# Patient Record
Sex: Male | Born: 1955
Health system: Southern US, Community
[De-identification: ages and names within clinical notes are randomized; demographics above are authoritative.]

## PROBLEM LIST (undated history)

## (undated) DIAGNOSIS — K7460 Unspecified cirrhosis of liver: Secondary | ICD-10-CM

## (undated) DIAGNOSIS — B1920 Unspecified viral hepatitis C without hepatic coma: Secondary | ICD-10-CM

## (undated) DIAGNOSIS — I10 Essential (primary) hypertension: Secondary | ICD-10-CM

## (undated) DIAGNOSIS — F1020 Alcohol dependence, uncomplicated: Secondary | ICD-10-CM

## (undated) DIAGNOSIS — R188 Other ascites: Secondary | ICD-10-CM

## (undated) DIAGNOSIS — K429 Umbilical hernia without obstruction or gangrene: Secondary | ICD-10-CM

## (undated) DIAGNOSIS — I8500 Esophageal varices without bleeding: Secondary | ICD-10-CM

## (undated) DIAGNOSIS — K8020 Calculus of gallbladder without cholecystitis without obstruction: Secondary | ICD-10-CM

## (undated) DIAGNOSIS — K649 Unspecified hemorrhoids: Secondary | ICD-10-CM

## (undated) HISTORY — DX: Unspecified cirrhosis of liver: K74.60

## (undated) HISTORY — DX: Essential (primary) hypertension: I10

---

## 2000-12-08 ENCOUNTER — Emergency Department (HOSPITAL_COMMUNITY): Admission: EM | Admit: 2000-12-08 | Discharge: 2000-12-08 | Payer: Self-pay | Admitting: Emergency Medicine

## 2013-03-04 ENCOUNTER — Encounter (HOSPITAL_COMMUNITY): Payer: Self-pay | Admitting: Emergency Medicine

## 2013-03-04 ENCOUNTER — Emergency Department (HOSPITAL_COMMUNITY)
Admission: EM | Admit: 2013-03-04 | Discharge: 2013-03-04 | Disposition: A | Discharge status: Home / Self Care | Payer: Medicaid Other | Attending: Emergency Medicine | Admitting: Emergency Medicine

## 2013-03-04 DIAGNOSIS — R141 Gas pain: Secondary | ICD-10-CM | POA: Insufficient documentation

## 2013-03-04 DIAGNOSIS — R143 Flatulence: Secondary | ICD-10-CM

## 2013-03-04 DIAGNOSIS — K429 Umbilical hernia without obstruction or gangrene: Secondary | ICD-10-CM | POA: Insufficient documentation

## 2013-03-04 DIAGNOSIS — F17200 Nicotine dependence, unspecified, uncomplicated: Secondary | ICD-10-CM | POA: Insufficient documentation

## 2013-03-04 DIAGNOSIS — R142 Eructation: Secondary | ICD-10-CM | POA: Insufficient documentation

## 2013-03-04 NOTE — ED Provider Notes (Signed)
CSN: [id number]     Arrival date & time 03/04/13  2040 History   First MD Initiated Contact with Patient 03/04/13 2222     Chief Complaint  Patient presents with  . Umbilical Hernia   (Consider location/radiation/quality/duration/timing/severity/associated sxs/prior Treatment) HPI Comments: A couple of weeks ago noted umbilical protrusion.  Has no pain it is soft adn can be "pushed back in " Here tonight because he just wants to know what it is   The history is provided by the patient.    History reviewed. No pertinent past medical history. History reviewed. No pertinent past surgical history. No family history on file. History  Substance Use Topics  . Smoking status: Current Every Day Smoker    Types: Cigarettes  . Smokeless tobacco: Not on file  . Alcohol Use: Yes     Comment: 2-4 drinks on a daily basis    Review of Systems  Unable to perform ROS Gastrointestinal: Positive for abdominal distention. Negative for abdominal pain.  All other systems reviewed and are negative.    Allergies  Review of patient's allergies indicates no known allergies.  Home Medications  No current outpatient prescriptions on file. BP 163/100  Pulse 73  Temp(Src) 98.4 F (36.9 C) (Oral)  Resp 16  Ht 6\' 2"  (1.88 m)  Wt 181 lb (82.101 kg)  BMI 23.23 kg/m2  SpO2 100% Physical Exam  Nursing note and vitals reviewed. Constitutional: He is oriented to person, place, and time. He appears well-developed and well-nourished.  HENT:  Head: Normocephalic.  Eyes: Pupils are equal, round, and reactive to light.  Neck: Normal range of motion.  Cardiovascular: Normal rate.   Pulmonary/Chest: Effort normal.  Abdominal: Soft. Bowel sounds are normal. He exhibits distension. There is no tenderness. There is no rebound. A hernia is present.  Easily reducible umbilical hernia  Musculoskeletal: Normal range of motion.  Neurological: He is alert and oriented to person, place, and time.  Skin: Skin is  warm.    ED Course  Procedures (including critical care time) Labs Review Labs Reviewed - No data to display Imaging Review No results found.  EKG Interpretation   None       MDM   1. Hernia, umbilical    Patient reassured and informed      Garald Balding, NP 03/04/13 2230  Garald Balding, NP 03/04/13 2231

## 2013-03-04 NOTE — Discharge Instructions (Signed)
Hernia A hernia occurs when an internal organ pushes out through a weak spot in the abdominal wall. Hernias most commonly occur in the groin and around the navel. Hernias often can be pushed back into place (reduced). Most hernias tend to get worse over time. Some abdominal hernias can get stuck in the opening (irreducible or incarcerated hernia) and cannot be reduced. An irreducible abdominal hernia which is tightly squeezed into the opening is at risk for impaired blood supply (strangulated hernia). A strangulated hernia is a medical emergency. Because of the risk for an irreducible or strangulated hernia, surgery may be recommended to repair a hernia. CAUSES   Heavy lifting.  Prolonged coughing.  Straining to have a bowel movement.  A cut (incision) made during an abdominal surgery. HOME CARE INSTRUCTIONS   Bed rest is not required. You may continue your normal activities.  Avoid lifting more than 10 pounds (4.5 kg) or straining.  Cough gently. If you are a smoker it is best to stop. Even the best hernia repair can break down with the continual strain of coughing. Even if you do not have your hernia repaired, a cough will continue to aggravate the problem.  Do not wear anything tight over your hernia. Do not try to keep it in with an outside bandage or truss. These can damage abdominal contents if they are trapped within the hernia sac.  Eat a normal diet.  Avoid constipation. Straining over long periods of time will increase hernia size and encourage breakdown of repairs. If you cannot do this with diet alone, stool softeners may be used. SEEK IMMEDIATE MEDICAL CARE IF:   You have a fever.  You develop increasing abdominal pain.  You feel nauseous or vomit.  Your hernia is stuck outside the abdomen, looks discolored, feels hard, or is tender.  You have any changes in your bowel habits or in the hernia that are unusual for you.  You have increased pain or swelling around the  hernia.  You cannot push the hernia back in place by applying gentle pressure while lying down. MAKE SURE YOU:   Understand these instructions.  Will watch your condition.  Will get help right away if you are not doing well or get worse. Document Released: 02/14/2005 Document Revised: 05/09/2011 Document Reviewed: 10/04/2007 [facility] Patient Information 2014 [place].  Emergency Department Resource Guide 1) Find a Doctor and Pay Out of Pocket Although you won't have to find out who is covered by your insurance plan, it is a good idea to ask around and get recommendations. You will then need to call the office and see if the doctor you have chosen will accept you as a new patient and what types of options they offer for patients who are self-pay. Some doctors offer discounts or will set up payment plans for their patients who do not have insurance, but you will need to ask so you aren't surprised when you get to your appointment.  2) Contact Your Local Health Department Not all health departments have doctors that can see patients for sick visits, but many do, so it is worth a call to see if yours does. If you don't know where your local health department is, you can check in your phone book. The CDC also has a tool to help you locate your state's health department, and many state websites also have listings of all of their local health departments.  3) Find a [facility] If your illness is not likely to  be very severe or complicated, you may want to try a walk in clinic. These are popping up all over the country in pharmacies, drugstores, and shopping centers. They're usually staffed by nurse practitioners or physician assistants that have been trained to treat common illnesses and complaints. They're usually fairly quick and inexpensive. However, if you have serious medical issues or chronic medical problems, these are probably not your best option.  No Primary Care Doctor: - Call  Health Connect at  [phone] - they can help you locate a primary care doctor that  accepts your insurance, provides certain services, etc. - Physician Referral Service- [phone]  Chronic Pain Problems: Organization         Address  Phone   Notes  [facility]  [phone] Patients need to be referred by their primary care doctor.   Medication Assistance: Organization         Address  Phone   Notes  [facility] Medication [facility] [address] [place], [place] [address] [phone] --Must be a resident of [facility] -- Must have NO insurance coverage whatsoever (no Medicaid/ Medicare, etc.) -- The pt. MUST have a primary care doctor that directs their care regularly and follows them in the community   MedAssist  [phone]   [employer]  [phone]    Agencies that provide inexpensive medical care: Organization         Address  Phone   Notes  [place]  [phone]   [facility]    [phone]   [facility] [place], [place] [address] [phone]   [place] [address] [place] [phone]   Planned Parenthood    [phone]   [facility]    [phone]   [place] and [place] [address], [place] Phone:  [phone], Fax:  [phone] Hours of Operation:  9 am - 6 pm, M-F.  Also accepts Medicaid/Medicare and self-pay.  [facility] for [place] [address], [address], [place] Phone: [phone], Fax: [phone]. Hours of Operation:  8:30 am - 5:30 pm, M-F.  Also accepts Medicaid and self-pay.  [facility] [place] [address], [place] Phone: [phone]   [place], [place], [place] [phone], Ext. [phone] Mondays & Thursdays: 7-9 AM.  First 15 patients are seen on a first come, first serve  basis.    [place] Providers:  Organization         Address  Phone   Notes  [facility] [address], [place] [phone] Also accepts self-pay patients.  [facility] [id number] [place], [place]  [phone]   [place], [address], [place] [phone]   [facility] [address], [place] [phone]   Lucianne Lei [address], [place]   [phone] Only accepts [place] Access [place] patients after they have their name applied to their card.   Self-Pay (no insurance) in [facility]:  Organization         Address  Phone   Notes  Sickle Cell Patients, [facility] Internal Medicine [place] [phone]   [facility] [place] [phone]   [facility] [place]  [place], [address], [place] [phone]   [facility]/Dr. Osei-Bonsu  [address] [place] or [address], [place] [phone] Phone number for both [place] and [place] locations is the same.  Urgent Medical and [facility] [address], [place] [phone]   [facility] [address] [place] or [address] [phone]   [facility] [address] [place] [phone], phone; [phone], fax Sees patients 1st and 3rd Saturday of every month.  Must not qualify for public or private insurance (i.e. Medicaid, Medicare, [place] Health Choice, Veterans' Benefits)  Household income should be no more than 200% of the poverty level The clinic cannot treat you if you are pregnant or think you are pregnant  Sexually transmitted diseases are not treated at the clinic.    Dental Care: Organization         Address  Phone  Notes  [facility] Department of [facility] [place] [phone] Accepts children up to age 64 who are enrolled in [place] or [place]; pregnant women with a Medicaid card; and children who have applied for Medicaid or [place] Health Choice, but were declined, whose parents can pay a reduced fee at time of service.  [facility] Department of [facility]  [address], [place] [phone] Accepts children up to age 5 who are enrolled in [place] or [place]; pregnant women with a Medicaid card; and children who have applied for Medicaid or [place] Health Choice, but were declined, whose parents can pay a reduced fee at time of service.  [place] Access PRO[facility]  [place] [phone] Patients are seen by appointment only. Walk-ins are not accepted. Alicia will see patients 57 years of age and older. Monday - Tuesday (8am-5pm) Most Wednesdays (8:30-5pm) $30 per visit, cash only  [facility] Adult Dental Access PROGRAM  [address], [facility] [phone] Patients are seen by appointment only. Walk-ins are not accepted. [place] will see patients 62 years of age and older. One Wednesday Evening (Monthly: Volunteer Based).  $30 per visit, cash only  [place]  [phone] for adults; Children under age 66, call Graduate Pediatric Dentistry at [phone]. Children aged 15-14, please call [phone] to request a pediatric application.  Dental services are provided in all areas of dental care including fillings, crowns and bridges, complete and partial dentures, implants, gum treatment, root canals, and extractions. Preventive care is also provided. Treatment is provided to both adults and children. Patients are selected via a lottery and there is often a waiting list.   [facility] [address], [place]  [phone]  [email or web address]   [facility] [address] [place], [place] [phone], Ext. [phone] Second and Fourth Thursday of each month, opens at 6:30 AM; Clinic ends at 9 AM.  Patients are seen on a first-come first-served basis, and a limited number are seen during each clinic.   [facility]  [address] Hillard Danker [place], [place] [phone]   Eligibility Requirements You must have lived in [place], [place], or [place] counties for at least the last three months.  You cannot be eligible for state or federal sponsored [employer], including [employer], [place], or [employer].   You generally cannot be eligible for healthcare insurance through your employer.    How to apply: Eligibility screenings are held every Tuesday and Wednesday afternoon from 1:00 pm until 4:00 pm. You do not need an appointment for the interview!  [facility] [address], [place], [place]   [place]  [place] Department  [place]  [phone]    Behavioral Health Resources in the Community: Intensive Outpatient Programs Organization         Address  Phone  Notes  [place]. [address] [place], [place] [phone]   [facility] Outpatient [address], [place], [place]   ADS: Alcohol & Drug Svcs [address] [place], [place]   [place] [address]  [place], [place] or [phone]   Substance Abuse Resources Organization         Address  Phone  Notes  Alcohol and Drug Services  [phone]   [place]  [phone]   The [place]   Chinita Pester  [phone]   Residential & Outpatient Substance Abuse Program  [phone]   Psychological Services Organization          Address  Phone  Notes  [facility] [place]  [place]  [phone]   [place] [address], [place] or [phone]    Mobile Crisis Teams Organization         Address  Phone  Notes  [facility]rapeutic Alternatives, Mobile Crisis Care Unit  [phone]   Assertive Psychotherapeutic Services  [address] [place], [place]   [place] Levels [address] [place] [phone]    Self-Help/Support Groups Organization         Address  Phone             Notes  [place]. of [place] - variety of support groups  [place] Call for more information  Narcotics Anonymous (NA), [facility]aring [facility] [address], [employer] [place]  2 meetings at this location   Special educational needs teacher         Address  Phone  Notes  [facility] Residential Treatment [place],    [place]  [phone]   [facility]  [address] [place] [phone], [place], [place]   [place], [place] [phone] Admissions: 8am-3pm M-F  Incentives Substance [place] [address],    [place], [place] [phone]   [facility] [address] [place], [place], [place]   [facility] [address],  [place], [place]   Insight Programs - Intensive Outpatient [address] Kristeen [address], [place], [place]   [facility] ([place].) [place].,  [place], [place] [phone] or [phone]   [facility] Services (RTS) [address] [place], [place] Accepts Medicaid  [facility] [place] [address]  [place] [phone] Substance Abuse/Addiction Treatment   [facility] Organization         Address  Phone  Notes  [facility]  [phone]   Domenic Schwab, PhD [phone]  [place]  Rd[place]ulio [place], [place]   [phone] or ([place]) [place], [place] [phone]   [facility] Recovery [address] [place], [place] [phone] Insurance/Medicaid/sponsorship through [facility] and Families [address] [place], [place] [phone] [place], [place] [phone]    Dr. Adele Schilder  [phone]   [facility] Dept. 1) [address] [place] 2) [place] 3)  [address], [place] [phone]  [phone]   [place] [phone] or [phone] (After Hours)    As discussed it your hernia starts to be painful or can not be pushed back in than return to the ED for treatment

## 2013-03-04 NOTE — ED Notes (Signed)
Pt reports noting a protrusion from his umbilicus x2 weeks ago, denies any pain or redness - pt was donating plasma today and was told is was possibly a hernia, pt states "i am just here for a diagnosis" - denies any other associating symptoms.

## 2013-03-04 NOTE — ED Provider Notes (Signed)
Medical screening examination/treatment/procedure(s) were conducted as a shared visit with non-physician practitioner(s) or resident  and myself.  I personally evaluated the patient during the encounter and agree with the findings and plan unless otherwise indicated.    I have personally reviewed any xrays and/ or EKG's with the provider and I agree with interpretation.   Umbilical hernia, no pain, no signs of infection. Exam abd soft/ NT, small umbilical hernia- easily reduceable, well appearing.  Reasons to return given, discussed what a hernia is.    Umbilical hernia.    Mariea Clonts, MD 03/04/13 [phone]

## 2013-03-31 DIAGNOSIS — K8020 Calculus of gallbladder without cholecystitis without obstruction: Secondary | ICD-10-CM

## 2013-03-31 DIAGNOSIS — R188 Other ascites: Secondary | ICD-10-CM

## 2013-03-31 HISTORY — DX: Other ascites: R18.8

## 2013-03-31 HISTORY — DX: Calculus of gallbladder without cholecystitis without obstruction: K80.20

## 2013-04-22 ENCOUNTER — Emergency Department (HOSPITAL_COMMUNITY): Payer: Medicaid Other

## 2013-04-22 ENCOUNTER — Encounter (HOSPITAL_COMMUNITY): Payer: Self-pay | Admitting: Emergency Medicine

## 2013-04-22 ENCOUNTER — Emergency Department (HOSPITAL_COMMUNITY)
Admission: EM | Admit: 2013-04-22 | Discharge: 2013-04-22 | Disposition: A | Discharge status: Home / Self Care | Payer: Medicaid Other | Attending: Emergency Medicine | Admitting: Emergency Medicine

## 2013-04-22 DIAGNOSIS — K759 Inflammatory liver disease, unspecified: Secondary | ICD-10-CM | POA: Insufficient documentation

## 2013-04-22 DIAGNOSIS — K429 Umbilical hernia without obstruction or gangrene: Secondary | ICD-10-CM | POA: Insufficient documentation

## 2013-04-22 DIAGNOSIS — R188 Other ascites: Secondary | ICD-10-CM

## 2013-04-22 DIAGNOSIS — F17200 Nicotine dependence, unspecified, uncomplicated: Secondary | ICD-10-CM | POA: Insufficient documentation

## 2013-04-22 HISTORY — DX: Other ascites: R18.8

## 2013-04-22 LAB — COMPREHENSIVE METABOLIC PANEL
ALT: 24 U/L (ref 0–53)
AST: 63 U/L — ABNORMAL HIGH (ref 0–37)
Albumin: 2.9 g/dL — ABNORMAL LOW (ref 3.5–5.2)
Alkaline Phosphatase: 180 U/L — ABNORMAL HIGH (ref 39–117)
BUN: 6 mg/dL (ref 6–23)
CO2: 24 meq/L (ref 19–32)
Calcium: 8.8 mg/dL (ref 8.4–10.5)
Chloride: 98 mEq/L (ref 96–112)
Creatinine, Ser: 0.85 mg/dL (ref 0.50–1.35)
GFR calc non Af Amer: >90 mL/min (ref >90)
GLUCOSE: 75 mg/dL (ref 70–99)
POTASSIUM: 3.7 meq/L (ref 3.7–5.3)
Sodium: 136 mEq/L — ABNORMAL LOW (ref 137–147)
Total Bilirubin: 1.2 mg/dL (ref 0.3–1.2)
Total Protein: 9.5 g/dL — ABNORMAL HIGH (ref 6.0–8.3)

## 2013-04-22 LAB — CBC WITH DIFFERENTIAL/PLATELET
Basophils Absolute: 0 10*3/uL (ref 0.0–0.1)
Basophils Relative: 1 % (ref 0–1)
Eosinophils Absolute: 0.1 10*3/uL (ref 0.0–0.7)
Eosinophils Relative: 1 % (ref 0–5)
HCT: 40.9 % (ref 39.0–52.0)
HEMOGLOBIN: 14.2 g/dL (ref 13.0–17.0)
LYMPHS ABS: 2.7 10*3/uL (ref 0.7–4.0)
LYMPHS PCT: 55 % — AB (ref 12–46)
MCH: 30 pg (ref 26.0–34.0)
MCHC: 34.7 g/dL (ref 30.0–36.0)
MCV: 86.3 fL (ref 78.0–100.0)
Monocytes Absolute: 0.4 10*3/uL (ref 0.1–1.0)
Monocytes Relative: 7 % (ref 3–12)
NEUTROS ABS: 1.7 10*3/uL (ref 1.7–7.7)
NEUTROS PCT: 36 % — AB (ref 43–77)
Platelets: 212 10*3/uL (ref 150–400)
RBC: 4.74 MIL/uL (ref 4.22–5.81)
RDW: 13.6 % (ref 11.5–15.5)
WBC: 4.9 10*3/uL (ref 4.0–10.5)

## 2013-04-22 LAB — URINALYSIS, ROUTINE W REFLEX MICROSCOPIC
Bilirubin Urine: NEGATIVE
Glucose, UA: NEGATIVE mg/dL
Hgb urine dipstick: NEGATIVE
KETONES UR: NEGATIVE mg/dL
NITRITE: NEGATIVE
Protein, ur: NEGATIVE mg/dL
Specific Gravity, Urine: 1.012 (ref 1.005–1.030)
Urobilinogen, UA: 1 mg/dL (ref 0.0–1.0)
pH: 5.5 (ref 5.0–8.0)

## 2013-04-22 LAB — URINE MICROSCOPIC-ADD ON

## 2013-04-22 LAB — LIPASE, BLOOD: LIPASE: 29 U/L (ref 11–59)

## 2013-04-22 NOTE — ED Notes (Signed)
States went to give plasma and was told he needed to have abd evaled , pt states abd  Has been this way since last month abd large and distended has umblical hernia and states it has been giving him problem and was recently dx w/ hep C

## 2013-04-22 NOTE — ED Notes (Addendum)
Pt reports having problems with his umbilical hernia and also wants to be checked for Hep C. Pt presents with protruding umbilical area black in color and hard distended abdomen. Last BM today but light. Last good formed stool couple days ago. Pt also c/o redness and swelling to B/L LE.

## 2013-04-22 NOTE — ED Provider Notes (Signed)
CSN: [id number]     Arrival date & time 04/22/13  6195 History   First MD Initiated Contact with Patient 04/22/13 [phone]     Chief Complaint  Patient presents with  . Hernia    umbilical     (Consider location/radiation/quality/duration/timing/severity/associated sxs/prior Treatment) HPI Pt presenting with c/o abdominal distension and swelling.  This is making his prior umbilical hernia protrude more than it had in the past.  He was told in December 2014 when donating plasma that he had tested positive for Hep C.  He has gradually developed swelling of his abdomen.  No abdominal pain, but states it is difficult to get comfortable due to the distension.  He also has some bilateral lower extremity swelling.  No fever/chills.  No vomiting.  There are no other associated systemic symptoms, there are no other alleviating or modifying factors.   Past Medical History  Diagnosis Date  . Hepatitis   . Ascites    History reviewed. No pertinent past surgical history. No family history on file. History  Substance Use Topics  . Smoking status: Current Every Day Smoker    Types: Cigarettes  . Smokeless tobacco: Not on file  . Alcohol Use: Yes     Comment: 2-4 drinks on a daily basis    Review of Systems ROS reviewed and all otherwise negative except for mentioned in HPI    Allergies  Review of patient's allergies indicates no known allergies.  Home Medications  No current outpatient prescriptions on file. BP 145/89  Pulse 70  Temp(Src) 97.9 F (36.6 C) (Oral)  Resp 20  Ht 6\' 2"  (1.88 m)  Wt 191 lb (86.637 kg)  BMI 24.51 kg/m2  SpO2 100% Vitals reviewed Physical Exam Physical Examination: General appearance - alert, well appearing, and in no distress Mental status - alert, oriented to person, place, and time Eyes - no conjunctival injection, no scleral icterus Mouth - mucous membranes moist, pharynx normal without lesions Chest - clear to auscultation, no wheezes, rales or  rhonchi, symmetric air entry Heart - normal rate, regular rhythm, normal S1, S2, no murmurs, rubs, clicks or gallops Abdomen - soft, nontender, diffuse distention of abdomen with protrusion of easily reducible approx 3cm umbilical hernia, no masses or organomegaly Extremities - peripheral pulses normal, no pedal edema, no clubbing or cyanosis Skin - normal coloration and turgor, no rashes  ED Course  Procedures (including critical care time) Labs Review Labs Reviewed  CBC WITH DIFFERENTIAL - Abnormal; Notable for the following:    Neutrophils Relative % 36 (*)    Lymphocytes Relative 55 (*)    All other components within normal limits  COMPREHENSIVE METABOLIC PANEL - Abnormal; Notable for the following:    Sodium 136 (*)    Total Protein 9.5 (*)    Albumin 2.9 (*)    AST 63 (*)    Alkaline Phosphatase 180 (*)    All other components within normal limits  URINALYSIS, ROUTINE W REFLEX MICROSCOPIC - Abnormal; Notable for the following:    Color, Urine AMBER (*)    APPearance HAZY (*)    Leukocytes, UA TRACE (*)    All other components within normal limits  LIPASE, BLOOD  URINE MICROSCOPIC-ADD ON   Imaging Review No results found.   EKG Interpretation  Date/Time:    Ventricular Rate:    PR Interval:    QRS Duration:   QT Interval:    QTC Calculation:   R Axis:     Text Interpretation:  MDM   Final diagnoses:  Ascites  Hepatitis    Pt presenting with c/o abdominal distension and protrusion of umbilcal hernia.  He has no fever, no abdominal tenderness- he is uncomfortable due to the ascites present.  He was recently diagnosed with hepatitis C.  Pt had therapeuric paracentesis and feels much improved.  Given information for GI followup.  Discharged with strict return precautions.  Pt agreeable with plan.   Threasa Beards, MD 04/26/13 [phone]

## 2013-04-22 NOTE — Discharge Instructions (Signed)
Return to the ED with any concerns including fever, abdominal pain, vomiting, decreased level of alertness/lethargy, or any other alarming symptoms

## 2013-04-22 NOTE — Procedures (Signed)
Successful US guided paracentesis from RLQ.  Yielded 6L of clear yellow fluid.  No immediate complications.  Pt tolerated well.   Specimen was not sent for labs.  Ascencion Dike PA-C 04/22/2013 1:53 PM\

## 2013-04-22 NOTE — ED Notes (Signed)
Was told in dec after he went back to donate plasma a second time that he had hep c

## 2013-05-06 ENCOUNTER — Emergency Department (HOSPITAL_COMMUNITY)
Admission: EM | Admit: 2013-05-06 | Discharge: 2013-05-06 | Disposition: A | Discharge status: Home / Self Care | Payer: Medicaid Other | Attending: Emergency Medicine | Admitting: Emergency Medicine

## 2013-05-06 ENCOUNTER — Emergency Department (HOSPITAL_COMMUNITY): Payer: Medicaid Other

## 2013-05-06 ENCOUNTER — Encounter (HOSPITAL_COMMUNITY): Payer: Self-pay | Admitting: Emergency Medicine

## 2013-05-06 DIAGNOSIS — Z8619 Personal history of other infectious and parasitic diseases: Secondary | ICD-10-CM | POA: Insufficient documentation

## 2013-05-06 DIAGNOSIS — M7989 Other specified soft tissue disorders: Secondary | ICD-10-CM

## 2013-05-06 DIAGNOSIS — R188 Other ascites: Secondary | ICD-10-CM | POA: Insufficient documentation

## 2013-05-06 DIAGNOSIS — F17200 Nicotine dependence, unspecified, uncomplicated: Secondary | ICD-10-CM | POA: Insufficient documentation

## 2013-05-06 LAB — URINALYSIS, ROUTINE W REFLEX MICROSCOPIC
Glucose, UA: NEGATIVE mg/dL
Hgb urine dipstick: NEGATIVE
KETONES UR: 15 mg/dL — AB
NITRITE: POSITIVE — AB
Protein, ur: NEGATIVE mg/dL
Specific Gravity, Urine: 1.029 (ref 1.005–1.030)
pH: 5.5 (ref 5.0–8.0)

## 2013-05-06 LAB — COMPREHENSIVE METABOLIC PANEL
ALBUMIN: 2.3 g/dL — AB (ref 3.5–5.2)
ALK PHOS: 153 U/L — AB (ref 39–117)
ALT: 15 U/L (ref 0–53)
AST: 48 U/L — ABNORMAL HIGH (ref 0–37)
BILIRUBIN TOTAL: 0.9 mg/dL (ref 0.3–1.2)
BUN: 8 mg/dL (ref 6–23)
CHLORIDE: 99 meq/L (ref 96–112)
CO2: 27 meq/L (ref 19–32)
CREATININE: 0.86 mg/dL (ref 0.50–1.35)
Calcium: 8.5 mg/dL (ref 8.4–10.5)
GLUCOSE: 86 mg/dL (ref 70–99)
Potassium: 3.8 mEq/L (ref 3.7–5.3)
Sodium: 137 mEq/L (ref 137–147)
Total Protein: 7.5 g/dL (ref 6.0–8.3)

## 2013-05-06 LAB — CBC WITH DIFFERENTIAL/PLATELET
BASOS PCT: 1 % (ref 0–1)
Basophils Absolute: 0 10*3/uL (ref 0.0–0.1)
Eosinophils Absolute: 0 10*3/uL (ref 0.0–0.7)
Eosinophils Relative: 0 % (ref 0–5)
HCT: 38.2 % — ABNORMAL LOW (ref 39.0–52.0)
HEMOGLOBIN: 13.5 g/dL (ref 13.0–17.0)
LYMPHS ABS: 2.2 10*3/uL (ref 0.7–4.0)
Lymphocytes Relative: 45 % (ref 12–46)
MCH: 29.7 pg (ref 26.0–34.0)
MCHC: 35.3 g/dL (ref 30.0–36.0)
MCV: 84.1 fL (ref 78.0–100.0)
MONO ABS: 0.5 10*3/uL (ref 0.1–1.0)
MONOS PCT: 10 % (ref 3–12)
NEUTROS ABS: 2.1 10*3/uL (ref 1.7–7.7)
Neutrophils Relative %: 43 % (ref 43–77)
Platelets: 195 10*3/uL (ref 150–400)
RBC: 4.54 MIL/uL (ref 4.22–5.81)
RDW: 13.5 % (ref 11.5–15.5)
WBC: 4.8 10*3/uL (ref 4.0–10.5)

## 2013-05-06 LAB — URINE MICROSCOPIC-ADD ON

## 2013-05-06 LAB — AMMONIA: AMMONIA: 26 umol/L (ref 11–60)

## 2013-05-06 MED ORDER — MORPHINE SULFATE 4 MG/ML IJ SOLN
6.0000 mg | Freq: Once | INTRAMUSCULAR | Status: DC
Start: 2013-05-06 — End: 2013-05-06

## 2013-05-06 MED ORDER — ONDANSETRON HCL 4 MG/2ML IJ SOLN: 4.0000 mg | Freq: Once | INTRAMUSCULAR | Status: DC

## 2013-05-06 NOTE — Procedures (Signed)
Successful US guided paracentesis from RLQ.  Yielded 9L of clear yellow fluid.  No immediate complications.  Pt tolerated well.   Specimen was not sent for labs.  Ascencion Dike PA-C 05/06/2013 3:24 PM

## 2013-05-06 NOTE — Discharge Instructions (Signed)
Ascites °Ascites is a gathering of fluid in the belly (abdomen). This is most often caused by liver disease. It may also be caused by a number of other less common problems. It causes a ballooning out (distension) of the abdomen. °CAUSES  °Scarring of the liver (cirrhosis) is the most common cause of ascites. Other causes include: °· Infection or inflammation in the abdomen. °· Cancer in the abdomen. °· Heart failure. °· Certain forms of kidney failure (nephritic syndrome). °· Inflammation of the pancreas. °· Clots in the veins of the liver. °SYMPTOMS  °In the early stages of ascites, you may not have any symptoms. The main symptom of ascites is a sense of abdominal bloating. This is due to the presence of fluid. This may also cause an increase in abdominal or waist size. People with this condition can develop swelling in the legs, and men can develop a swollen scrotum. When there is a lot of fluid, it may be hard to breath. Stretching of the abdomen by fluid can be painful. °DIAGNOSIS  °Certain features of your medical history, such as a history of liver disease and of an enlarging abdomen, can suggest the presence of ascites. The diagnosis of ascites can be made on physical exam by your caregiver. An abdominal ultrasound examination can confirm that ascites is present, and estimate the amount of fluid. °Once ascites is confirmed, it is important to determine its cause. Again, a history of one of the conditions listed in "CAUSES" provides a strong clue. A physical exam is important, and blood and X-ray tests may be needed. During a procedure called paracentesis, a sample of fluid is removed from the abdomen. This can determine certain key features about the fluid, such as whether or not infection or cancer is present. Your caregiver will determine if a paracentesis is necessary. They will describe the procedure to you. °PREVENTION  °Ascites is a complication of other conditions. Therefore to prevent ascites, you  must seek treatment for any significant health conditions you have. Once ascites is present, careful attention to fluid and salt intake may help prevent it from getting worse. If you have ascites, you should not drink alcohol. °PROGNOSIS  °The prognosis of ascites depends on the underlying disease. If the disease is reversible, such as with certain infections or with heart failure, then ascites may improve or disappear. When ascites is caused by cirrhosis, then it indicates that the liver disease has worsened, and further evaluation and treatment of the liver disease is needed. If your ascites is caused by cancer, then the success or failure of the cancer treatment will determine whether your ascites will improve or worsen. °RISKS AND COMPLICATIONS  °Ascites is likely to worsen if it is not properly diagnosed and treated. A large amount of ascites can cause pain and difficulty breathing. The main complication, besides worsening, is infection (called spontaneous bacterial peritonitis). This requires prompt treatment. °TREATMENT  °The treatment of ascites depends on its cause. When liver disease is your cause, medical management using water pills (diuretics) and decreasing salt intake is often effective. Ascites due to peritoneal inflammation or malignancy (cancer) alone does not respond to salt restriction and diuretics. Hospitalization is sometimes required. °If the treatment of ascites cannot be managed with medications, a number of other treatments are available. Your caregivers will help you decide which will work best for you. Some of these are: °· Removal of fluid from the abdomen (paracentesis). °· Fluid from the abdomen is passed into a vein (peritoneovenous shunting). °·   Liver transplantation.  Transjugular intrahepatic portosystemic stent shunt. HOME CARE INSTRUCTIONS  It is important to monitor body weight and the intake and output of fluids. Weigh yourself at the same time every day. Record your  weights. Fluid restriction may be necessary. It is also important to know your salt intake. The more salt you take in, the more fluid you will retain. Ninety percent of people with ascites respond to this approach.  Follow any directions for medicines carefully.  Follow up with your caregiver, as directed.  Report any changes in your health, especially any new or worsening symptoms.  If your ascites is from liver disease, avoid alcohol and other substances toxic to the liver. SEEK MEDICAL CARE IF:   Your weight increases more than a few pounds in a few days.  Your abdominal or waist size increases.  You develop swelling in your legs.  You had swelling and it worsens. SEEK IMMEDIATE MEDICAL CARE IF:   You develop a fever.  You develop new abdominal pain.  You develop difficulty breathing.  You develop confusion.  You have bleeding from the mouth, stomach, or rectum. MAKE SURE YOU:   Understand these instructions.  Will watch your condition.  Will get help right away if you are not doing well or get worse. Document Released: 02/14/2005 Document Revised: 05/09/2011 Document Reviewed: 09/15/2006 [facility] Patient Information 2014 Shawn Nguyen.   Paracentesis Paracentesis is a procedure used to remove excess fluid from the belly (abdomen). Excess fluid in the belly is called ascites. Excess fluid can be the result of certain conditions, such as infection, inflammation, abdominal injury, heart failure, chronic scarring of the liver (cirrhosis), or cancer. The excess fluid is removed using a needle inserted through the skin and tissue into the abdomen.  A paracentesis may be done to:  Determine the cause of the excess fluid through examination of the fluid.  Relieve symptoms of shortness of breath or pain caused by the excess fluid.  Determine presence of bleeding after an abdominal injury. LET YOUR CAREGIVERS KNOW ABOUT:  Allergies.  Medications taken including herbs,  eye drops, over-the-counter medications, and creams.  Use of steroids (by mouth or creams).  Previous problems with anesthetics or numbing medicine.  Possibility of pregnancy, if this applies.  History of blood clots (thrombophlebitis).  History of bleeding or blood problems.  Previous surgery.  Other health problems. RISKS AND COMPLICATIONS  Injury to an abdominal organ, such as the bowel (large intestine), liver, spleen, or bladder.  Possible infection.  Bleeding.  Low blood pressure (hypotension). BEFORE THE PROCEDURE This is a procedure that can be done as an outpatient. Confirm the time that you need to arrive for your procedure. A blood sample may be done to determine your blood clotting time. The presence of a severe bleeding disorder (coagulopathy) which cannot be promptly corrected may make this procedure inadvisable. You may be asked to urinate. PROCEDURE The procedure will take about 30 minutes. This time will vary depending on the amount of fluid that is removed. You may be asked to lie on your back with your head elevated. An area on your abdomen will be cleansed. A numbing medicine may then be injected (local anesthesia) into the skin and tissue. A needle is inserted through your abdominal skin and tissues until it is positioned in your abdomen. You may feel pressure or slight pain as the needle is positioned into the abdomen. Fluid is removed from the abdomen through the needle. Tell your caregiver if you  feel dizzy or lightheaded. The needle is withdrawn once the desired amount of fluid has been removed. A sample of the fluid may be sent for examination.  AFTER THE PROCEDURE Your recovery will be assessed and monitored. If there are no problems, as an outpatient, you should be able to go home shortly after the procedure. There may be a very limited amount of clear fluid draining from the needle insertion site over the next 2 days. Confirm with your caregiver as to the  expected amount of drainage. Obtaining the Test Results It is your responsibility to obtain your test results. Do not assume everything is normal if you have not heard from your caregiver or the medical facility. It is important for you to follow up on all of your test results. HOME CARE INSTRUCTIONS   You may resume normal diet and activities as directed or allowed.  Only take over-the-counter or prescription medicines for pain, discomfort, or fever as directed by your caregiver. SEEK IMMEDIATE MEDICAL CARE IF:  You develop shortness of breath or chest pain.  You develop increasing pain, discomfort, or swelling in your abdomen.  You develop new drainage or pus coming from site where fluid was removed.  You develop swelling or increased redness from site where fluid was removed.  You develop an unexplained temperature of 102 F (38.9 C) or above. Document Released: 08/30/2004 Document Revised: 05/09/2011 Document Reviewed: 10/06/2008 [facility] Patient Information 2014 [employer].

## 2013-05-06 NOTE — Progress Notes (Signed)
*  Preliminary Results* Right lower extremity venous duplex completed. Right lower extremity is negative for deep vein thrombosis. There is no evidence of right Baker's cyst.  05/06/2013 12:50 PM  Maudry Mayhew, RVT, RDCS, RDMS

## 2013-05-06 NOTE — ED Provider Notes (Signed)
CSN: [id number]     Arrival date & time 05/06/13  0848 History   First MD Initiated Contact with Patient 05/06/13 [phone]     No chief complaint on file.    (Consider location/radiation/quality/duration/timing/severity/associated sxs/prior Treatment) HPI Shawn Nguyen Is a 58 year old male who presents the emergency department chief complaint of abdominal swelling.  The patient was seen here 2 weeks ago on 04/22/2013 for new onset ascites with a diagnosis of hepatitis C.  Patient states that this is all new to him.  He was discharged with instructions to followup with gastroenterology.  Patient states he did attempt to follow up however he was denied the visit because he is uninsured and does not have the money to pay.  Patient presents today with increased abdominal swelling, he's got swelling bilateral legs worse on the right.  He has no abdominal pain, fever, chills, heat or redness to the belly.  And does complain of some shortness of breath secondary to his increased abdominal girth.  Patient denies alcohol abuse or Tylenol abuse.  Past Medical History  Diagnosis Date  . Hepatitis   . Ascites    No past surgical history on file. No family history on file. History  Substance Use Topics  . Smoking status: Current Every Day Smoker    Types: Cigarettes  . Smokeless tobacco: Not on file  . Alcohol Use: Yes     Comment: 2-4 drinks on a daily basis    Review of Systems  Ten systems reviewed and are negative for acute change, except as noted in the HPI.    Allergies  Review of patient's allergies indicates no known allergies.  Home Medications  No current outpatient prescriptions on file. There were no vitals taken for this visit. Physical Exam  Nursing note and vitals reviewed. Constitutional: He appears well-developed and well-nourished. No distress.  HENT:  Head: Normocephalic and atraumatic.  Multiple missing front teeth  Eyes: Conjunctivae are normal. No scleral icterus.   Neck: Normal range of motion. Neck supple.  Cardiovascular: Normal rate, regular rhythm and normal heart sounds.   Bilateral pitting edema, increased swelling on the right lower extremity.  Distal pulses intact  Pulmonary/Chest: Effort normal and breath sounds normal. No respiratory distress.  Abdominal: He exhibits distension. There is no tenderness.  Tight swollen and distended abdomen with protruding umbilical hernia.  Positive fluid wave, no heat, no redness, no tenderness.  Musculoskeletal: He exhibits no edema.  Neurological: He is alert.  Skin: Skin is warm and dry. He is not diaphoretic.  Psychiatric: His behavior is normal.    ED Course  Procedures (including critical care time) Labs Review Labs Reviewed - No data to display Imaging Review No results found.   EKG Interpretation None      MDM   Final diagnoses:  Ascites    Patient here with ascites.  I did contact care management to come and speak with the patient and feel he is appropriate candidate for community health and wellness followup as he does need  management of his hepatitis C.  No concern for spontaneous bacterial peritonitis.  No elevation in his white count, ammonia levels.  Patient does have elevated AST and alkaline phosphatase.  I have ordered ultrasound guided therapeutic paracentesis.   12:50 PM Patient negative for DVT.   3:30 PM Patient returned from paracentesis. States he is feeling much better. Results are pending    3:47 PM Results show patient had 9 L of fluid removed. He did receive IV  albumin 25g Patient has f/u with [place] [facility] in 2 days.   Margarita Mail, PA-C 05/06/13 1548

## 2013-05-06 NOTE — ED Notes (Signed)
Patient transported to Ultrasound 

## 2013-05-06 NOTE — ED Notes (Signed)
Pt from home. States he has a umbilical hernia. Had procedure last month to draw fluid off. Pt states he was unable to follow-up and started to feel some discomfort last pm. Pt also complains of right leg swelling.

## 2013-05-06 NOTE — Progress Notes (Addendum)
ED CM consulted to meet with patient concerning f/u care. Pt presented to the [facility] ED with abdominal discomfort and leg swelling. Pt was seen 2/23 for with c/o abdominal distension and swelling underwent a paracentesis, and was referred to [facility] for f/u, and  in need of a GI referral. Pt does not have insurance, is completing the [employer]. Pt has an appt with Johnnette Barrios at [facility] on 3/11 [facility] [employer] production planner. Will contact [facility] to schedule appointment for establishing care. Verified contact number with patient. Discussed that someone will contact him with appt to establish care. Pt verbalized understanding teach back done.Discussed plan with Dr. Reather Converse EDP. No further ED CM needs identified.

## 2013-05-06 NOTE — ED Provider Notes (Signed)
Medical screening examination/treatment/procedure(s) were conducted as a shared visit with non-physician practitioner(s) or resident and myself. I personally evaluated the patient during the encounter and agree with the findings and plan unless otherwise indicated.  I have personally reviewed any xrays and/ or EKG's with the provider and I agree with interpretation.  Worsening ascites/ abdo distension. Hep C hx. Similar to previous. Pt has had 6 L removed in the past. No focal pain, fevers or vomiting. Well appearing otherwise. Abd distended, fluid wave, no focal tenderness or guarding, right LE swelling mild pitting. Plan for [place] LE, bedside US for fluid, therapeutic paracentesis. Social work for [employer] as pt unable to see GI due to financial reasons. No concern for SBP at this time.  Pt receiving therapeutic paracentesis then fup outpt. 9 L drained, clear/ yellow. Fup arranged.    Ultrasound limited abdominal and limited transthoracic ultrasound (FAST)  Indication: abd distension, hep C hx  Four views were obtained using the low frequency transducer: Splenorenal, Hepatorenal, Retrovesical,  Interpretation: Significant ascites/ free fluid visualized surrounding the kidneys, pelvis  Images archived electronically  Dr. Reather Converse personally performed and interpreted the images   Ascites, Abdominal distension, hepatitis   Mariea Clonts, MD 05/06/13 1727

## 2013-05-07 LAB — URINE CULTURE
COLONY COUNT: NO GROWTH
Culture: NO GROWTH

## 2013-05-08 ENCOUNTER — Ambulatory Visit: Payer: Medicaid Other | Attending: Internal Medicine

## 2013-05-18 ENCOUNTER — Encounter: Payer: Self-pay | Admitting: Internal Medicine

## 2013-05-18 ENCOUNTER — Ambulatory Visit: Payer: Medicaid Other | Attending: Internal Medicine | Admitting: Internal Medicine

## 2013-05-18 VITALS — BP 165/108 | HR 66 | Temp 98.0°F | Ht 74.0 in | Wt 189.0 lb

## 2013-05-18 DIAGNOSIS — K7460 Unspecified cirrhosis of liver: Secondary | ICD-10-CM

## 2013-05-18 DIAGNOSIS — R188 Other ascites: Secondary | ICD-10-CM

## 2013-05-18 DIAGNOSIS — I10 Essential (primary) hypertension: Secondary | ICD-10-CM

## 2013-05-18 DIAGNOSIS — Z0000 Encounter for general adult medical examination without abnormal findings: Secondary | ICD-10-CM

## 2013-05-18 LAB — ACUTE HEP PANEL AND HEP B SURFACE AB
HCV Ab: REACTIVE — AB
HEP A IGM: NONREACTIVE
HEP B S AG: NEGATIVE
Hep B C IgM: NONREACTIVE
Hep B S Ab: NEGATIVE

## 2013-05-18 LAB — POCT GLYCOSYLATED HEMOGLOBIN (HGB A1C): Hemoglobin A1C: 5

## 2013-05-18 LAB — TSH: TSH: 6.173 u[IU]/mL — AB (ref 0.350–4.500)

## 2013-05-18 MED ORDER — FUROSEMIDE 40 MG PO TABS: 40.0000 mg | ORAL_TABLET | Freq: Every day | ORAL | Status: DC

## 2013-05-18 MED ORDER — POTASSIUM CHLORIDE CRYS ER 20 MEQ PO TBCR: 20.0000 meq | EXTENDED_RELEASE_TABLET | Freq: Every day | ORAL | Status: DC

## 2013-05-18 NOTE — Progress Notes (Unsigned)
Patient ID: Shawn Nguyen, male   DOB: 1955/09/04, 58 y.o.   MRN: [id number]   HPI: Shawn Nguyen is a 58 y.o. male presenting on 05/18/2013 with ascites discovered in January. He had a paracentesis on Feb 23rd and again on 3/21 in the ER. He feels his abdomen is distended again but not to the point where he is dyspneic. Right leg also swollen. Doppler ordered by ER was negative for DVT.     Past Medical History  Diagnosis Date  . Hepatitis   . Ascites     No past surgical history on file.  Current Outpatient Prescriptions--new meds  Medication Sig Dispense Refill  . furosemide (LASIX) 40 MG tablet Take 1 tablet (40 mg total) by mouth daily.  30 tablet  3  . potassium chloride SA (K-DUR,KLOR-CON) 20 MEQ tablet Take 1 tablet (20 mEq total) by mouth daily.  30 tablet  3   No current facility-administered medications for this visit.    No Known Allergies  family history  - father DM -sister breast cancer  History   Social History  . Marital Status: Single    Spouse Name: N/A    Number of Children: N/A  . Years of Education: N/A   Occupational History  . Not on file.   Social History Main Topics  . Smoking status: Current Every Day Smoker-- 2-3 cig/day- started age 22    Types: Cigarettes  . Smokeless tobacco: Not on file  . Alcohol Use: Yes     Comment: has cut back to 1 or none- moderate drinker in the past  . Drug Use: No     Comment: former 2005  . Sexual Activity: Not on file   Other Topics Concern  . Not on file   Social History Narrative  . No narrative on file    Review of Systems  Review of Systems  Constitutional: Negative for fever, chills, diaphoresis, activity change, appetite change and fatigue.  HENT: Negative for ear pain, nosebleeds, congestion, facial swelling, rhinorrhea, neck pain, neck stiffness and ear discharge.  Eyes: Negative for pain, discharge, redness, itching and visual disturbance.  Respiratory: Negative for cough, choking,  chest tightness, shortness of breath, wheezing and stridor.  Cardiovascular: Negative for chest pain, palpitations ++ leg swelling.  Gastrointestinal: ++ abdominal distention, Negative forvomiting, diarrhea or consitpation Genitourinary: Negative for dysuria, urgency, frequency, hematuria, flank pain, decreased urine volume, difficulty urinating and dyspareunia.  Musculoskeletal: Negative for back pain, joint swelling, arthralgias or gait problem.  Neurological: Negative for dizziness, tremors, seizures, syncope, facial asymmetry, speech difficulty, weakness, light-headedness, numbness and headaches.  Hematological: Negative for adenopathy. Does not bruise/bleed easily.  Psychiatric/Behavioral: Negative for hallucinations, behavioral problems, confusion, dysphoric mood   Objective:  BP 165/108  Pulse 66  Temp(Src) 98 F (36.7 C) (Oral)  Ht 6\' 2"  (1.88 m)  Wt 189 lb (85.73 kg)  BMI 24.26 kg/m2 Filed Weights   05/18/13 0914  Weight: 189 lb (85.73 kg)     Physical Exam  Constitutional: Appears well-developed and well-nourished. No distress. HENT: Normocephalic. External right and left ear normal. Oropharynx is clear and moist. - poor dentition. Eyes: Conjunctivae and EOM are normal. PERRLA, no scleral icterus.  Neck: Normal ROM. Neck supple. No JVD. No tracheal deviation. No thyromegaly.  CVS: RRR, S1/S2 +, no murmurs, no gallops, no carotid bruit.  Pulmonary: Effort and breath sounds normal, no stridor, rhonchi, wheezes, rales.  Abdominal: Soft. BS +,  ++ distension, tenderness, rebound or guarding. Umbilical hernia which  is compressible. Musculoskeletal: Normal range of motion. ++ edema right > left and no tenderness.  Neuro: Alert. Normal reflexes, muscle tone coordination. No cranial nerve deficit. Skin: Skin is warm and dry. No rash noted. Not diaphoretic. No erythema. No pallor.  Psychiatric: Normal mood and affect. Behavior, judgment, thought content normal.   Lab Results   Component Value Date   WBC 4.8 05/06/2013   HGB 13.5 05/06/2013   HCT 38.2* 05/06/2013   MCV 84.1 05/06/2013   PLT 195 05/06/2013   Lab Results  Component Value Date   CREATININE 0.86 05/06/2013   BUN 8 05/06/2013   NA 137 05/06/2013   K 3.8 05/06/2013   CL 99 05/06/2013   CO2 27 05/06/2013    No results found for this basename: HGBA1C   Lipid Panel  No results found for this basename: chol,  trig,  hdl,  cholhdl,  vldl,  ldlcalc        There are no active problems to display for this patient.    Preventative Medicine:  No health maintenance topics applied.  No health maintenance topics applied.  Colonoscopy : ordered   LAB WORK: ordered  Vitamin D : Lipid Panel: TSH: PSA:    Assessment and plan: Ascites Cirrhosis- -- Plan: Acute Hep Panel & Hep B Surface Ab  - add Lasix 40 mg daily along with KCL 20 meq daily and f/u in 1 wk for weight and labs  HTN (hypertension) -  - adding lasix for now- knows about salt restriction  Routine history and physical examination of adult -Ambulatory referral to Gastroenterology for colonoscopy  -TSH, Lipid panel, POCT glycosylated hemoglobin (Hb A1C), PSA,  -Vit D  25 hydroxy (rtn osteoporosis monitoring),     Return in about 1 month (around 06/18/2013).   The patient was given clear instructions to go to ER or return to medical center if symptoms don't improve, worsen or new problems develop. The patient verbalized understanding. The patient was told to call to get lab results if they haven't heard anything in the next week.     Debbe Odea, MD

## 2013-05-19 LAB — PSA: PSA: 0.22 ng/mL (ref <=4.00)

## 2013-05-20 ENCOUNTER — Telehealth: Payer: Self-pay | Admitting: *Deleted

## 2013-05-20 LAB — VITAMIN D 25 HYDROXY (VIT D DEFICIENCY, FRACTURES): Vit D, 25-Hydroxy: <10 ng/mL — ABNORMAL LOW (ref 30–89)

## 2013-05-24 ENCOUNTER — Ambulatory Visit: Payer: Medicaid Other | Attending: Internal Medicine

## 2013-05-24 ENCOUNTER — Other Ambulatory Visit: Payer: Self-pay

## 2013-05-24 DIAGNOSIS — I10 Essential (primary) hypertension: Secondary | ICD-10-CM

## 2013-05-24 DIAGNOSIS — K7460 Unspecified cirrhosis of liver: Secondary | ICD-10-CM

## 2013-05-24 DIAGNOSIS — R188 Other ascites: Secondary | ICD-10-CM

## 2013-05-24 LAB — LIPID PANEL
CHOLESTEROL: 108 mg/dL (ref 0–200)
HDL: 30 mg/dL — ABNORMAL LOW (ref >39)
LDL CALC: 67 mg/dL (ref 0–99)
Total CHOL/HDL Ratio: 3.6 Ratio
Triglycerides: 55 mg/dL (ref <150)
VLDL: 11 mg/dL (ref 0–40)

## 2013-05-24 LAB — BASIC METABOLIC PANEL
BUN: 8 mg/dL (ref 6–23)
CHLORIDE: 100 meq/L (ref 96–112)
CO2: 28 mEq/L (ref 19–32)
Calcium: 8.4 mg/dL (ref 8.4–10.5)
Creat: 0.87 mg/dL (ref 0.50–1.35)
Glucose, Bld: 70 mg/dL (ref 70–99)
POTASSIUM: 4.2 meq/L (ref 3.5–5.3)
Sodium: 134 mEq/L — ABNORMAL LOW (ref 135–145)

## 2013-06-14 ENCOUNTER — Ambulatory Visit: Payer: Medicaid Other | Attending: Internal Medicine | Admitting: Internal Medicine

## 2013-06-14 ENCOUNTER — Encounter: Payer: Self-pay | Admitting: Internal Medicine

## 2013-06-14 VITALS — BP 135/86 | HR 69 | Temp 98.7°F | Resp 16 | Wt 191.8 lb

## 2013-06-14 DIAGNOSIS — F1010 Alcohol abuse, uncomplicated: Secondary | ICD-10-CM | POA: Insufficient documentation

## 2013-06-14 DIAGNOSIS — I10 Essential (primary) hypertension: Secondary | ICD-10-CM

## 2013-06-14 DIAGNOSIS — B1920 Unspecified viral hepatitis C without hepatic coma: Secondary | ICD-10-CM | POA: Insufficient documentation

## 2013-06-14 DIAGNOSIS — R1900 Intra-abdominal and pelvic swelling, mass and lump, unspecified site: Secondary | ICD-10-CM | POA: Insufficient documentation

## 2013-06-14 DIAGNOSIS — F17200 Nicotine dependence, unspecified, uncomplicated: Secondary | ICD-10-CM | POA: Insufficient documentation

## 2013-06-14 DIAGNOSIS — R188 Other ascites: Secondary | ICD-10-CM | POA: Insufficient documentation

## 2013-06-14 DIAGNOSIS — B182 Chronic viral hepatitis C: Secondary | ICD-10-CM | POA: Insufficient documentation

## 2013-06-14 DIAGNOSIS — K7460 Unspecified cirrhosis of liver: Secondary | ICD-10-CM

## 2013-06-14 HISTORY — DX: Unspecified cirrhosis of liver: K74.60

## 2013-06-14 MED ORDER — SPIRONOLACTONE 25 MG PO TABS: 25.0000 mg | ORAL_TABLET | Freq: Every day | ORAL | Status: DC

## 2013-06-14 MED ORDER — FUROSEMIDE 40 MG PO TABS: 40.0000 mg | ORAL_TABLET | Freq: Every day | ORAL | Status: DC

## 2013-06-14 MED ORDER — POTASSIUM CHLORIDE CRYS ER 20 MEQ PO TBCR: 20.0000 meq | EXTENDED_RELEASE_TABLET | Freq: Every day | ORAL | Status: DC

## 2013-06-14 NOTE — Progress Notes (Signed)
Patient complains of abd swelling that started last night Woke up today and looked in the mirror and said he didn't like what he saw Also complains of bilateral leg swelling Had a paracentesis on 2/27 and 3/9 His weight has been fluctuating between 189lbs and 191lbs

## 2013-06-14 NOTE — Progress Notes (Signed)
MRN: [id number] Name: Shawn Nguyen  Sex: male Age: 58 y.o. DOB: Feb 20, 1956  Allergies: Review of patient's allergies indicates no known allergies.  Chief Complaint  Patient presents with  . abd swelling    HPI: Patient is 58 y.o. male who has history of cirrhosis hep C , ascites, patient was seen by Dr. Wynelle Cleveland last month and was started on Lasix and potassium, prior to that patient had been to the ER 2 times and had paracentesis done, patient had a scheduled appointment in 3 days but he came today is walk in because he felt the swelling was worse patient has gained 3 pounds compared to last visit, denies any chest pain or shortness of breath denies any change in bowel habit patient is still has been drinking alcohol, have advised patient to quit drinking, he has been referred to GI and has not been able to make appointment yet. Patient denies any fever chills.  Past Medical History  Diagnosis Date  . Hepatitis   . Ascites     History reviewed. No pertinent past surgical history.    Medication List       This list is accurate as of: 06/14/13 11:14 AM.  Always use your most recent med list.               furosemide 40 MG tablet  Commonly known as:  LASIX  Take 1 tablet (40 mg total) by mouth daily.     potassium chloride SA 20 MEQ tablet  Commonly known as:  K-DUR,KLOR-CON  Take 1 tablet (20 mEq total) by mouth daily.     spironolactone 25 MG tablet  Commonly known as:  ALDACTONE  Take 1 tablet (25 mg total) by mouth daily.        Meds ordered this encounter  Medications  . spironolactone (ALDACTONE) 25 MG tablet    Sig: Take 1 tablet (25 mg total) by mouth daily.    Dispense:  90 tablet    Refill:  3  . furosemide (LASIX) 40 MG tablet    Sig: Take 1 tablet (40 mg total) by mouth daily.    Dispense:  30 tablet    Refill:  3  . potassium chloride SA (K-DUR,KLOR-CON) 20 MEQ tablet    Sig: Take 1 tablet (20 mEq total) by mouth daily.    Dispense:  30 tablet     Refill:  3     There is no immunization history on file for this patient.  History reviewed. No pertinent family history.  History  Substance Use Topics  . Smoking status: Current Every Day Smoker    Types: Cigarettes  . Smokeless tobacco: Not on file  . Alcohol Use: Yes     Comment: 2-4 drinks on a daily basis    Review of Systems   As noted in HPI  Filed Vitals:   06/14/13 1035  BP: 135/86  Pulse: 69  Temp: 98.7 F (37.1 C)  Resp: 16    Physical Exam  Physical Exam  Eyes: EOM are normal. Pupils are equal, round, and reactive to light. Scleral icterus is present.  Cardiovascular: Normal rate and regular rhythm.   Pulmonary/Chest: Breath sounds normal. No respiratory distress. He has no wheezes.  Abdominal: There is no tenderness. There is no rebound.  Abdominal distension/ umblical hernia ,   Musculoskeletal:  Bilateral lower extremity pitting edema L>R     CBC    Component Value Date/Time   WBC 4.8 05/06/2013 0920  RBC 4.54 05/06/2013 0920   HGB 13.5 05/06/2013 0920   HCT 38.2* 05/06/2013 0920   PLT 195 05/06/2013 0920   MCV 84.1 05/06/2013 0920   LYMPHSABS 2.2 05/06/2013 0920   MONOABS 0.5 05/06/2013 0920   EOSABS 0.0 05/06/2013 0920   BASOSABS 0.0 05/06/2013 0920    CMP     Component Value Date/Time   NA 134* 05/24/2013 0906   K 4.2 05/24/2013 0906   CL 100 05/24/2013 0906   CO2 28 05/24/2013 0906   GLUCOSE 70 05/24/2013 0906   BUN 8 05/24/2013 0906   CREATININE 0.87 05/24/2013 0906   CREATININE 0.86 05/06/2013 0920   CALCIUM 8.4 05/24/2013 0906   PROT 7.5 05/06/2013 0920   ALBUMIN 2.3* 05/06/2013 0920   AST 48* 05/06/2013 0920   ALT 15 05/06/2013 0920   ALKPHOS 153* 05/06/2013 0920   BILITOT 0.9 05/06/2013 0920   GFRNONAA >90 05/06/2013 0920   GFRAA >90 05/06/2013 0920    Lab Results  Component Value Date/Time   CHOL 108 05/24/2013  9:06 AM    No components found with this basename: hga1c    Lab Results  Component Value Date/Time   AST 48* 05/06/2013  9:20 AM     Assessment and Plan  Ascites - Plan: Patient is on Lasix I have started him on spironolactone (ALDACTONE) 25 MG tablet, furosemide (LASIX) 40 MG tablet, potassium chloride SA (K-DUR,KLOR-CON) 20 MEQ tablet, COMPLETE METABOLIC PANEL WITH GFR Patient will need paracentesis.  Cirrhosis/Hepatitis C - Plan: COMPLETE METABOLIC PANEL WITH GFR  Patient has already been referred to GI, the staff will help him to schedule appointment.  HTN (hypertension) Continue with the DASH diet patient is on Lasix and has been started on spironolactone. Will repeat blood chemistry today.  Patient has a close followup already scheduled in 3 days.  Lorayne Marek, MD

## 2013-06-17 ENCOUNTER — Ambulatory Visit: Payer: Medicaid Other | Attending: Internal Medicine | Admitting: Internal Medicine

## 2013-06-17 ENCOUNTER — Encounter: Payer: Self-pay | Admitting: Internal Medicine

## 2013-06-17 VITALS — BP 134/92 | HR 78 | Temp 98.3°F | Resp 16 | Ht 74.0 in | Wt 192.0 lb

## 2013-06-17 DIAGNOSIS — K7460 Unspecified cirrhosis of liver: Secondary | ICD-10-CM | POA: Insufficient documentation

## 2013-06-17 DIAGNOSIS — Z09 Encounter for follow-up examination after completed treatment for conditions other than malignant neoplasm: Secondary | ICD-10-CM | POA: Insufficient documentation

## 2013-06-17 DIAGNOSIS — Z0000 Encounter for general adult medical examination without abnormal findings: Secondary | ICD-10-CM

## 2013-06-17 DIAGNOSIS — R188 Other ascites: Secondary | ICD-10-CM

## 2013-06-17 DIAGNOSIS — I10 Essential (primary) hypertension: Secondary | ICD-10-CM

## 2013-06-17 DIAGNOSIS — K769 Liver disease, unspecified: Secondary | ICD-10-CM | POA: Insufficient documentation

## 2013-06-17 LAB — COMPLETE METABOLIC PANEL WITH GFR
ALBUMIN: 2.8 g/dL — AB (ref 3.5–5.2)
ALT: 15 U/L (ref 0–53)
AST: 42 U/L — ABNORMAL HIGH (ref 0–37)
Alkaline Phosphatase: 145 U/L — ABNORMAL HIGH (ref 39–117)
BUN: 8 mg/dL (ref 6–23)
CALCIUM: 8.6 mg/dL (ref 8.4–10.5)
CHLORIDE: 96 meq/L (ref 96–112)
CO2: 29 meq/L (ref 19–32)
Creat: 0.72 mg/dL (ref 0.50–1.35)
GFR, Est African American: >89 mL/min
Glucose, Bld: 73 mg/dL (ref 70–99)
POTASSIUM: 4.9 meq/L (ref 3.5–5.3)
Sodium: 132 mEq/L — ABNORMAL LOW (ref 135–145)
Total Bilirubin: 1 mg/dL (ref 0.2–1.2)
Total Protein: 7.6 g/dL (ref 6.0–8.3)

## 2013-06-17 NOTE — Patient Instructions (Signed)
Liver Disease Diet The liver disease diet offers guidelines regarding what foods to eat while affected by a liver disease. The liver is the largest organ in the body and is involved in many important bodily functions. Some of the liver's functions are to remove harmful substances from the bloodstream and to make sure they can exit the body. The liver also produces fluids used by the body and helps use and store energy from food. Liver disease affects the functioning of the liver and the way your body uses energy from foods. An important part of the liver disease diet is to get the right amount of calories from a variety of foods. GUIDELINES Sodium Sodium is a mineral that helps the body change the amount of water and fluids it holds. Too much sodium can cause the body to hold too much fluid. You may need to decrease the amount of sodium in your diet if your body is collecting fluid in your stomach or legs. To decrease the amount of sodium in your diet, do not add extra salt to foods. Also, limit or avoid foods that contain lots of sodium, such as:  Salted snacks (pretzels, potato chips, crackers).  Canned foods (vegetables, soups, juice).  Salted or cured meats and deli meats.  Condiments (ketchup, mustard, soy sauce, barbecue sauce).  Sauerkraut and pickles.  Frozen dinners or processed or preserved foods. A diet of less than 2000 mg of sodium may be recommended. Check food labels for specific levels of sodium.  Alcohol Drinking alcohol may harm your liver. Avoid or limit drinks containing alcohol, such as beer, wine, and hard liquor.  Calories It is important to make sure you are getting enough calories in your diet so that your body gets enough energy and stays at a healthy weight. Include a variety of foods in your diet. Carbohydrates Carbohydrates are found in foods such as breads and starches, grains (oats, flour), cereals, and some vegetables (corn, peas). Carbohydrates change the level  of sugar (glucose) in the blood. Advanced liver disease can affect how much glucose is in the blood, making the glucose level too high or too low. Eating carbohydrates in the right amount will help control your blood glucose. A registered dietician can help you determine how much carbohydrate you need each day. Protein Eating the right amount of protein every day is important for liver disease. Protein is found in foods such as meat, poultry (chicken, [place]), fish, milk, eggs, yogurt, peanuts, peanut butter, and beans. Include a protein-containing food at each meal. Document Released: 02/14/2005 Document Revised: 05/09/2011 Document Reviewed: 01/06/2011 [facility] Patient Information 2014 [place]. Ascites Ascites is a gathering of fluid in the belly (abdomen). This is most often caused by liver disease. It may also be caused by a number of other less common problems. It causes a ballooning out (distension) of the abdomen. CAUSES  Scarring of the liver (cirrhosis) is the most common cause of ascites. Other causes include:  Infection or inflammation in the abdomen.  Cancer in the abdomen.  Heart failure.  Certain forms of kidney failure (nephritic syndrome).  Inflammation of the pancreas.  Clots in the veins of the liver. SYMPTOMS  In the early stages of ascites, you may not have any symptoms. The main symptom of ascites is a sense of abdominal bloating. This is due to the presence of fluid. This may also cause an increase in abdominal or waist size. People with this condition can develop swelling in the legs, and men can  develop a swollen scrotum. When there is a lot of fluid, it may be hard to breath. Stretching of the abdomen by fluid can be painful. DIAGNOSIS  Certain features of your medical history, such as a history of liver disease and of an enlarging abdomen, can suggest the presence of ascites. The diagnosis of ascites can be made on physical exam by your caregiver. An  abdominal ultrasound examination can confirm that ascites is present, and estimate the amount of fluid. Once ascites is confirmed, it is important to determine its cause. Again, a history of one of the conditions listed in "CAUSES" provides a strong clue. A physical exam is important, and blood and X-ray tests may be needed. During a procedure called paracentesis, a sample of fluid is removed from the abdomen. This can determine certain key features about the fluid, such as whether or not infection or cancer is present. Your caregiver will determine if a paracentesis is necessary. They will describe the procedure to you. PREVENTION  Ascites is a complication of other conditions. Therefore to prevent ascites, you must seek treatment for any significant health conditions you have. Once ascites is present, careful attention to fluid and salt intake may help prevent it from getting worse. If you have ascites, you should not drink alcohol. PROGNOSIS  The prognosis of ascites depends on the underlying disease. If the disease is reversible, such as with certain infections or with heart failure, then ascites may improve or disappear. When ascites is caused by cirrhosis, then it indicates that the liver disease has worsened, and further evaluation and treatment of the liver disease is needed. If your ascites is caused by cancer, then the success or failure of the cancer treatment will determine whether your ascites will improve or worsen. RISKS AND COMPLICATIONS  Ascites is likely to worsen if it is not properly diagnosed and treated. A large amount of ascites can cause pain and difficulty breathing. The main complication, besides worsening, is infection (called spontaneous bacterial peritonitis). This requires prompt treatment. TREATMENT  The treatment of ascites depends on its cause. When liver disease is your cause, medical management using water pills (diuretics) and decreasing salt intake is often effective.  Ascites due to peritoneal inflammation or malignancy (cancer) alone does not respond to salt restriction and diuretics. Hospitalization is sometimes required. If the treatment of ascites cannot be managed with medications, a number of other treatments are available. Your caregivers will help you decide which will work best for you. Some of these are:  Removal of fluid from the abdomen (paracentesis).  Fluid from the abdomen is passed into a vein (peritoneovenous shunting).  Liver transplantation.  Transjugular intrahepatic portosystemic stent shunt. HOME CARE INSTRUCTIONS  It is important to monitor body weight and the intake and output of fluids. Weigh yourself at the same time every day. Record your weights. Fluid restriction may be necessary. It is also important to know your salt intake. The more salt you take in, the more fluid you will retain. Ninety percent of people with ascites respond to this approach.  Follow any directions for medicines carefully.  Follow up with your caregiver, as directed.  Report any changes in your health, especially any new or worsening symptoms.  If your ascites is from liver disease, avoid alcohol and other substances toxic to the liver. SEEK MEDICAL CARE IF:   Your weight increases more than a few pounds in a few days.  Your abdominal or waist size increases.  You develop swelling in your  legs.  You had swelling and it worsens. SEEK IMMEDIATE MEDICAL CARE IF:   You develop a fever.  You develop new abdominal pain.  You develop difficulty breathing.  You develop confusion.  You have bleeding from the mouth, stomach, or rectum. MAKE SURE YOU:   Understand these instructions.  Will watch your condition.  Will get help right away if you are not doing well or get worse. Document Released: 02/14/2005 Document Revised: 05/09/2011 Document Reviewed: 09/15/2006 [facility] Patient Information 2014 [place].

## 2013-06-17 NOTE — Progress Notes (Signed)
Patient ID: Shawn Nguyen, male   DOB: 1955-05-27, 58 y.o.   MRN: [id number]   Vinicius Brockman, is a 58 y.o. male  IPJ:[id number]  BHA:[id number]  DOB - 1955/09/07  Chief Complaint  Patient presents with  . Follow-up        Subjective:   Shawn Nguyen is a 58 y.o. male here today for a follow up visit. Patient was seen here on Friday last week for routine followup, but this appointment was already made a few months back. Patient is requesting a referral to gastroenterologist for colonoscopy for routine colon cancer screening. He has not had colonoscopy done since turning 51. He has no new complaint. He claims he feels better with the diuretics but not able to sleep on his back because of shortness of breath. He sleeps on his side, feels a little more comfortable that way. He has an appointment with gastroenterologist coming up but for his hepatitis C and end-stage liver disease. He wants colonoscopy included in that visit. His legs and also swollen but he is on furosemide 40 mg tablet and spironolactone 25 mg tablet daily. Patient has No headache, No chest pain, No abdominal pain - No Nausea, No new weakness tingling or numbness, No Cough - SOB.  Problem  Routine History and Physical Examination of Adult    ALLERGIES: No Known Allergies  PAST MEDICAL HISTORY: Past Medical History  Diagnosis Date  . Hepatitis   . Ascites     MEDICATIONS AT HOME: Prior to Admission medications   Medication Sig Start Date End Date Taking? Authorizing Provider  furosemide (LASIX) 40 MG tablet Take 1 tablet (40 mg total) by mouth daily. 06/14/13  Yes Lorayne Marek, MD  potassium chloride SA (K-DUR,KLOR-CON) 20 MEQ tablet Take 1 tablet (20 mEq total) by mouth daily. 06/14/13  Yes Lorayne Marek, MD  spironolactone (ALDACTONE) 25 MG tablet Take 1 tablet (25 mg total) by mouth daily. 06/14/13  Yes Lorayne Marek, MD     Objective:   Filed Vitals:   06/17/13 0907  BP: 134/92  Pulse: 78  Temp: 98.3 F  (36.8 C)  TempSrc: Oral  Resp: 16  Height: 6\' 2"  (1.88 m)  Weight: 192 lb (87.091 kg)  SpO2: 100%    Exam General appearance : Awake, alert, not in any distress. Speech Clear. Chronically ill-looking with temporal wasting, mild icterus HEENT: Atraumatic and Normocephalic, pupils equally reactive to light and accomodation Neck: supple, no JVD. No cervical lymphadenopathy.  Chest:Good air entry bilaterally, no added sounds  CVS: S1 S2 regular, no murmurs.  Abdomen: Markedly distended, ascites +++, no other palpably enlarged Extremities: B/L Lower Ext shows marked edema, both legs are warm to touch Neurology: Awake alert, and oriented X 3, CN II-XII intact, Non focal Skin:No Rash Wounds:N/A  Data Review Lab Results  Component Value Date   HGBA1C 5.0 05/18/2013     Assessment & Plan   1. Routine history and physical examination of adult Patient request and colonoscopy, we'll refer to gastroenterologist for his liver disease as well as for a lung cancer screening  - [facility] COLONOSCOPY - Ambulatory referral to Gastroenterology  2. HTN (hypertension) Continue diuretics  3. Cirrhosis Patient was counseled extensively about nutrition for liver disease Patient was given clear instructions to proceed to the ER for paracentesis if he develops shortness of breath from massive ascites before his appointment with gastroenterologist.   Return in about 3 months (around 09/16/2013) for Abdominal Pain, Liver Disease.  The patient was given clear instructions  to go to ER or return to medical center if symptoms don't improve, worsen or new problems develop. The patient verbalized understanding. The patient was told to call to get lab results if they haven't heard anything in the next week.   This note has been created with Surveyor, quantity. Any transcriptional errors are unintentional.    Angelica Chessman, MD, [place], [place], [place] and [facility] [place], [place]   06/17/2013, 9:45 AM

## 2013-06-17 NOTE — Progress Notes (Signed)
Pt is here following up on his Ascites, Cirrhosis, Hepatitis C and his alcohol abuse. Pt is requesting a referral for a colonoscopy.

## 2013-07-29 DIAGNOSIS — K429 Umbilical hernia without obstruction or gangrene: Secondary | ICD-10-CM

## 2013-07-29 HISTORY — DX: Umbilical hernia without obstruction or gangrene: K42.9

## 2013-08-05 ENCOUNTER — Other Ambulatory Visit (INDEPENDENT_AMBULATORY_CARE_PROVIDER_SITE_OTHER): Payer: Medicaid Other

## 2013-08-05 ENCOUNTER — Encounter: Payer: Self-pay | Admitting: Internal Medicine

## 2013-08-05 ENCOUNTER — Ambulatory Visit (INDEPENDENT_AMBULATORY_CARE_PROVIDER_SITE_OTHER): Payer: Medicaid Other | Admitting: Internal Medicine

## 2013-08-05 ENCOUNTER — Other Ambulatory Visit: Payer: Self-pay | Admitting: Internal Medicine

## 2013-08-05 VITALS — BP 120/80 | HR 88 | Ht 74.0 in | Wt 174.0 lb

## 2013-08-05 DIAGNOSIS — R188 Other ascites: Secondary | ICD-10-CM

## 2013-08-05 DIAGNOSIS — B1920 Unspecified viral hepatitis C without hepatic coma: Secondary | ICD-10-CM

## 2013-08-05 DIAGNOSIS — R609 Edema, unspecified: Secondary | ICD-10-CM

## 2013-08-05 DIAGNOSIS — K429 Umbilical hernia without obstruction or gangrene: Secondary | ICD-10-CM

## 2013-08-05 LAB — PROTIME-INR
INR: 1.3 ratio — ABNORMAL HIGH (ref 0.8–1.0)
PROTHROMBIN TIME: 14.8 s — AB (ref 9.6–13.1)

## 2013-08-05 LAB — BASIC METABOLIC PANEL
BUN: 5 mg/dL — ABNORMAL LOW (ref 6–23)
CALCIUM: 9 mg/dL (ref 8.4–10.5)
CHLORIDE: 99 meq/L (ref 96–112)
CO2: 28 meq/L (ref 19–32)
Creatinine, Ser: 0.6 mg/dL (ref 0.4–1.5)
GFR: 175.09 mL/min (ref >60.00)
GLUCOSE: 83 mg/dL (ref 70–99)
Potassium: 4.2 mEq/L (ref 3.5–5.1)
SODIUM: 135 meq/L (ref 135–145)

## 2013-08-05 NOTE — Assessment & Plan Note (Signed)
Check RNA

## 2013-08-05 NOTE — Progress Notes (Signed)
Referred by [place] Subjective:    Patient ID: Shawn Nguyen, male    DOB: 1955-09-05, 58 y.o.   MRN: [id number]  HPI The patient is a very nice 58 year old African American man, with ascites and presumed cirrhosis. He has tested positive for the hepatitis C virus antibody. He does relate a history of heavy regular alcohol use and drug use in the past to the drug use was many years ago. In late 2014 developed increased abdominal distention and protrusion of an umbilical hernia was discovered to have ascites and has had 2 paracenteses. He is improved with this and has been started on furosemide and Spirinolactone. Over time he is losing weight and fluid. Wt Readings from Last 3 Encounters:  08/05/13 174 lb (78.926 kg)  06/17/13 192 lb (87.091 kg)  06/14/13 191 lb 12.8 oz (87 kg)   Ultrasound of the abdomen completed in February did not demonstrate cirrhosis though he had gallstones and ascites. 6 L of ascites drawn that day at 9 L on 05/06/2013  He has not yet stopped drinking alcohol though he was reducing. He drinks about 3 beers a day. He is unemployed, he formerly worked at [employer] and that has worked.jobs. He is currently living in a shelter and is going to be moving to the [employer] were he be living for a year. No Known Allergies Outpatient Prescriptions Prior to Visit  Medication Sig Dispense Refill  . furosemide (LASIX) 40 MG tablet Take 1 tablet (40 mg total) by mouth daily.  30 tablet  3  . potassium chloride SA (K-DUR,KLOR-CON) 20 MEQ tablet Take 1 tablet (20 mEq total) by mouth daily.  30 tablet  3  . spironolactone (ALDACTONE) 25 MG tablet Take 1 tablet (25 mg total) by mouth daily.  90 tablet  3   No facility-administered medications prior to visit.   Past Medical History  Diagnosis Date  . Hepatitis   . Ascites   . Cirrhosis   . HTN (hypertension)    History reviewed. No pertinent past surgical  history. History   Social History  . Marital Status: Single    Spouse Name: N/A    Number of Children: 2  . Years of Education: N/A   Social History Main Topics  . Smoking status: Current Every Day Smoker    Types: Cigarettes  . Smokeless tobacco: Never Used  . Alcohol Use: Yes     Comment: 2-4 drinks on a daily basis  . Drug Use: No     Comment: former 2005  . Sexual Activity: None   Other Topics Concern  . None   Social History Narrative  .  single, 1 son one daughter, formerly worked as a Biochemist, clinical, he has been unemployed for a number of years.    Family History  Problem Relation Age of Onset  . Diabetes Father     Review of Systems Positive for those things mentioned in the history of present illness he denies other problems at this time. All other review of systems negative.    Objective:   Physical Exam General:  Well-developed, well-nourished and in no acute distress Eyes:  Anicteric.+ muddy ENT:   Mouth and posterior pharynx free of lesions. Poor dentition Neck:   supple w/o thyromegaly or mass.  Lungs: Clear to auscultation bilaterally w/ decreased BS LL lobe Heart:  S1S2, no rubs, murmurs, gallops. Abdomen:  moderate ascites with umbilical hernia -  reducible - not tender, no HSM/mass Lymph:  no cervical or supraclavicular adenopathy. Extremities:   no edema Skin   no rash. Neuro:  A&O x 3.  Psych:  appropriate mood and  Affect.   Data Reviewed: Labs, notes in the EMR, ultrasound reports.    Assessment & Plan:   1. Ascites   2. Umbilical hernia   3. Edema   4. Hepatitis C    1. Cirrhosis of the liver certainly would be the #1 etiologic possibility in him but there are a few atypical things in that the platelets are normal, his pleural effusion is on the left, and the ultrasound does not demonstrate cirrhosis though it can be in sensitive to find out. I think it is possible there could be another cause of ascites and edema like heart failure,  I did not see markedly jugular venous distention but not always a consideration. 2. E. wall another paracentesis including studies to include cell count, culture, total protein and total albumin. 3. Hepatitis C quantitative RNA comment that his a antibody total hepatitis B core antibody total be checked. He does not have hepatitis B surface antibody and nor surface antigen positive. We'll also check a protime and a basic metabolic panel. 4. Further plans could include an echocardiogram, could include upper GI endoscopy are all the above. Await the studies from the ascites and the blood and we will call with results and arrange followup accordingly. 5. He is advised to stop all alcohol  I appreciate the opportunity to care for this patient. CC: Angelica Chessman, MD

## 2013-08-05 NOTE — Patient Instructions (Addendum)
You have been given a separate informational sheet regarding your tobacco use, the importance of quitting and local resources to help you quit.  Your physician has requested that you go to the basement for lab work before leaving today.  You have been set up for a paracentesis at [facility] on 08/08/13 at 11:00am, arrive in Radiology at 10:45am.   I appreciate the opportunity to care for you.

## 2013-08-06 LAB — HEPATITIS A ANTIBODY, TOTAL: Hep A Total Ab: NONREACTIVE

## 2013-08-06 LAB — HEPATITIS B CORE ANTIBODY, TOTAL: Hep B Core Total Ab: NONREACTIVE

## 2013-08-07 LAB — HEPATITIS C RNA QUANTITATIVE
HCV QUANT LOG: 5.99 {Log} — AB (ref <1.18)
HCV Quantitative: 970499 IU/mL — ABNORMAL HIGH (ref <15)

## 2013-08-08 ENCOUNTER — Ambulatory Visit (HOSPITAL_COMMUNITY)
Admission: RE | Admit: 2013-08-08 | Discharge: 2013-08-08 | Disposition: A | Discharge status: Home / Self Care | Payer: Medicaid Other | Source: Ambulatory Visit | Attending: Internal Medicine | Admitting: Internal Medicine

## 2013-08-08 VITALS — BP 121/76

## 2013-08-08 DIAGNOSIS — B1920 Unspecified viral hepatitis C without hepatic coma: Secondary | ICD-10-CM | POA: Insufficient documentation

## 2013-08-08 DIAGNOSIS — R188 Other ascites: Secondary | ICD-10-CM | POA: Insufficient documentation

## 2013-08-08 LAB — BODY FLUID CELL COUNT WITH DIFFERENTIAL
Eos, Fluid: 0 %
LYMPHS FL: 21 %
Monocyte-Macrophage-Serous Fluid: 79 % (ref 50–90)
Neutrophil Count, Fluid: 0 % (ref 0–25)
Total Nucleated Cell Count, Fluid: 231 cu mm (ref 0–1000)

## 2013-08-08 LAB — PROTEIN, BODY FLUID: Total protein, fluid: 5 g/dL

## 2013-08-08 LAB — ALBUMIN, FLUID (OTHER): ALBUMIN FL: 2 g/dL

## 2013-08-08 NOTE — Procedures (Signed)
US guided diagnostic/therapeutic paracentesis performed yielding 5 liters (maximum ordered) yellow fluid. A portion of the fluid was sent to the lab for preordered studies. No immediate complications.

## 2013-08-09 LAB — PATHOLOGIST SMEAR REVIEW

## 2013-08-11 LAB — BODY FLUID CULTURE: Culture: NO GROWTH

## 2013-08-15 ENCOUNTER — Other Ambulatory Visit: Payer: Self-pay

## 2013-08-15 DIAGNOSIS — K7460 Unspecified cirrhosis of liver: Secondary | ICD-10-CM

## 2013-08-15 NOTE — Progress Notes (Signed)
Quick Note:  Studies show he is definitely + for hepatitis C No infection in abdominal fluid  Protein levels suggest he could have poor heart pumping function  Next step is an echocardiogram - dx. Left pleural effusion and ascites - evaluate RV and LV function   ______

## 2013-08-23 ENCOUNTER — Ambulatory Visit (HOSPITAL_COMMUNITY): Payer: Medicaid Other | Attending: Internal Medicine | Admitting: Radiology

## 2013-08-23 DIAGNOSIS — I10 Essential (primary) hypertension: Secondary | ICD-10-CM | POA: Diagnosis not present

## 2013-08-23 DIAGNOSIS — R188 Other ascites: Secondary | ICD-10-CM | POA: Diagnosis not present

## 2013-08-23 DIAGNOSIS — R609 Edema, unspecified: Secondary | ICD-10-CM | POA: Insufficient documentation

## 2013-08-23 DIAGNOSIS — I079 Rheumatic tricuspid valve disease, unspecified: Secondary | ICD-10-CM | POA: Insufficient documentation

## 2013-08-23 DIAGNOSIS — K7460 Unspecified cirrhosis of liver: Secondary | ICD-10-CM

## 2013-08-23 DIAGNOSIS — B199 Unspecified viral hepatitis without hepatic coma: Secondary | ICD-10-CM | POA: Insufficient documentation

## 2013-08-23 NOTE — Progress Notes (Signed)
Echocardiogram performed.  

## 2013-08-28 NOTE — Progress Notes (Signed)
Quick Note:  Echocardiogram shows good heart function so seems like problem is liver cirrhosis Ask him how he is re: edema and ascites  He needs an EGD to screen for varices - not urgent  Could do at hospital I have time later in July - I have a Thurs-Fri and then Wed-Fri (split weeks) ______

## 2013-09-02 ENCOUNTER — Telehealth: Payer: Self-pay | Admitting: Internal Medicine

## 2013-09-02 ENCOUNTER — Telehealth: Payer: Self-pay | Admitting: *Deleted

## 2013-09-02 NOTE — Telephone Encounter (Signed)
No it is not He is probably leaking ascites through umbilical hernia. Can we have an APP see him this week? If this worsens(gets bad) head to ED

## 2013-09-02 NOTE — Telephone Encounter (Signed)
Message copied by Greggory Keen on Mon Sep 02, 2013  1:40 PM ------      Message from: Silvano Rusk E      Created: Wed Aug 28, 2013  8:00 AM       Echocardiogram shows good heart function so seems like problem is liver cirrhosis      Ask him how he is re: edema and ascites            He needs an EGD to screen for varices - not urgent            Could do at hospital I have time later in July - I have a Thurs-Fri and then Wed-Fri (split weeks) ------

## 2013-09-02 NOTE — Telephone Encounter (Signed)
Patient is agreeable to this plan-advised if he worsens or it changes acutely he should go to the ER

## 2013-09-02 NOTE — Telephone Encounter (Signed)
Patient left a voicemail for someone to give him a call back. Called patient back and no answer. Left a message for patient to return call.

## 2013-09-02 NOTE — Telephone Encounter (Signed)
States this started 2 days ago. He has the navel covered with a piece of gauze and has to change it every few hours to keep from getting fluid on his shirt. Denies pain, fever or odor.

## 2013-09-02 NOTE — Telephone Encounter (Signed)
Patient aware of results-see other telephone call

## 2013-09-06 ENCOUNTER — Ambulatory Visit (INDEPENDENT_AMBULATORY_CARE_PROVIDER_SITE_OTHER): Payer: Medicaid Other | Admitting: Nurse Practitioner

## 2013-09-06 ENCOUNTER — Encounter: Payer: Self-pay | Admitting: Nurse Practitioner

## 2013-09-06 VITALS — BP 128/76 | HR 66 | Ht 74.0 in | Wt 155.4 lb

## 2013-09-06 DIAGNOSIS — T148XXA Other injury of unspecified body region, initial encounter: Secondary | ICD-10-CM

## 2013-09-06 DIAGNOSIS — IMO0002 Reserved for concepts with insufficient information to code with codable children: Secondary | ICD-10-CM

## 2013-09-06 DIAGNOSIS — B182 Chronic viral hepatitis C: Secondary | ICD-10-CM

## 2013-09-06 NOTE — Patient Instructions (Addendum)
Tye Savoy NP-C will be calling you regarding your hernia.   You can go to [facility] on [phone] @ [phone].

## 2013-09-08 ENCOUNTER — Encounter: Payer: Self-pay | Admitting: Nurse Practitioner

## 2013-09-08 DIAGNOSIS — IMO0002 Reserved for concepts with insufficient information to code with codable children: Secondary | ICD-10-CM | POA: Insufficient documentation

## 2013-09-08 NOTE — Progress Notes (Signed)
     History of Present Illness:   Patient is a 58 year old male recently evaluated by Dr. Carlean Purl for ascites. It isn't clear if he has cirrhosis, workup is in progress. Patient has a paracentesis early June, serum albumin not done so can't calculate SAAG.  Patient worked in today for complaints of leakage from umbilical hernia. Drainage has actually stopped, it was present on Monday and Tuesday. Patient feels fine. No abdominal pain  Current Medications, Allergies, Past Medical History, Past Surgical History, Family History and Social History were reviewed in [employer] record.  Physical Exam: General: Pleasant, thin blac male in no acute distress Head: Normocephalic and atraumatic Eyes:  sclerae anicteric, conjunctiva pink  Ears: Normal auditory acuity Lungs: Clear throughout to auscultation Heart: Regular rate and rhythm Abdomen: Soft, non distended, non-tender. No masses Normal bowel sounds. Large reproducible umbilical hernia. No areas of leakage but tiny scabs present Musculoskeletal: Symmetrical with no gross deformities  Extremities: No edema  Neurological: Alert oriented x 4, grossly nonfocal Psychological:  Alert and cooperative. Normal mood and affect  Assessment and Recommendations:  1. Umbilical  Hernia. He had some scant drainage oozing from around hernia earlier this week. Suspect drainage was from tiny skin laceration which has now scabbed over. Encouraged to keep around hernia moisturized. He is upset that the large hernia interferes with playing basketball / running. Advised himj to try using and abdominal binder when physically active. If patient doesn't have cirrhosis then he may be operative candidate.   2. Ascites, resolved. Etiology unclear but workup in progress. Couldn't calculate SAAG from ascitic fluid drawn off inJune as serum albumin not obtained. Will will make a two month follow up with Dr. Carlean Purl

## 2013-09-09 NOTE — Progress Notes (Signed)
He is HCV + and needs a referral to ID clinic for this  Needs an HCV genotype and hepetic elastography test dx HCV

## 2013-09-09 NOTE — Progress Notes (Signed)
Agree with Ms. Guenther's assessment and plan. Carl E. Gessner, MD, FACG   

## 2013-09-10 ENCOUNTER — Telehealth: Payer: Self-pay

## 2013-09-10 NOTE — Telephone Encounter (Signed)
Message copied by Marlon Pel on Tue Sep 10, 2013  3:32 PM ------      Message from: Silvano Rusk E      Created: Mon Sep 09, 2013  5:57 PM      Regarding: vaccines       Needs Hep A and Hep B vaccines and needs pneumonia vaccine            Due to chronic HCV and chronic liver dz ------

## 2013-09-10 NOTE — Telephone Encounter (Signed)
Patient advised of recommendations.  He would like to start vaccines when he comes for the office visit in September.  He has to catch a bus to get here and is hard to come for extra appointments

## 2013-09-16 ENCOUNTER — Encounter: Payer: Self-pay | Admitting: Internal Medicine

## 2013-09-16 ENCOUNTER — Ambulatory Visit: Payer: Medicaid Other | Attending: Internal Medicine | Admitting: Internal Medicine

## 2013-09-16 VITALS — BP 133/85 | HR 60 | Temp 98.4°F | Resp 16 | Ht 74.0 in | Wt 155.0 lb

## 2013-09-16 DIAGNOSIS — F17200 Nicotine dependence, unspecified, uncomplicated: Secondary | ICD-10-CM | POA: Diagnosis not present

## 2013-09-16 DIAGNOSIS — I10 Essential (primary) hypertension: Secondary | ICD-10-CM | POA: Insufficient documentation

## 2013-09-16 DIAGNOSIS — K769 Liver disease, unspecified: Secondary | ICD-10-CM

## 2013-09-16 DIAGNOSIS — K759 Inflammatory liver disease, unspecified: Secondary | ICD-10-CM | POA: Insufficient documentation

## 2013-09-16 NOTE — Progress Notes (Signed)
Pt is here following up on his Ascites, Cirrhosis of the liver, Hepatitis C, HTN, and his Alcohol abuse.

## 2013-09-16 NOTE — Patient Instructions (Signed)
Liver Disease Diet The liver disease diet offers guidelines regarding what foods to eat while affected by a liver disease. The liver is the largest organ in the body and is involved in many important bodily functions. Some of the liver's functions are to remove harmful substances from the bloodstream and to make sure they can exit the body. The liver also produces fluids used by the body and helps use and store energy from food. Liver disease affects the functioning of the liver and the way your body uses energy from foods. An important part of the liver disease diet is to get the right amount of calories from a variety of foods. GUIDELINES Sodium Sodium is a mineral that helps the body change the amount of water and fluids it holds. Too much sodium can cause the body to hold too much fluid. You may need to decrease the amount of sodium in your diet if your body is collecting fluid in your stomach or legs. To decrease the amount of sodium in your diet, do not add extra salt to foods. Also, limit or avoid foods that contain lots of sodium, such as:  Salted snacks (pretzels, potato chips, crackers).  Canned foods (vegetables, soups, juice).  Salted or cured meats and deli meats.  Condiments (ketchup, mustard, soy sauce, barbecue sauce).  Sauerkraut and pickles.  Frozen dinners or processed or preserved foods. A diet of less than 2000 mg of sodium may be recommended. Check food labels for specific levels of sodium.  Alcohol Drinking alcohol may harm your liver. Avoid or limit drinks containing alcohol, such as beer, wine, and hard liquor.  Calories It is important to make sure you are getting enough calories in your diet so that your body gets enough energy and stays at a healthy weight. Include a variety of foods in your diet. Carbohydrates Carbohydrates are found in foods such as breads and starches, grains (oats, flour), cereals, and some vegetables (corn, peas). Carbohydrates change the level  of sugar (glucose) in the blood. Advanced liver disease can affect how much glucose is in the blood, making the glucose level too high or too low. Eating carbohydrates in the right amount will help control your blood glucose. A registered dietician can help you determine how much carbohydrate you need each day. Protein Eating the right amount of protein every day is important for liver disease. Protein is found in foods such as meat, poultry (chicken, [place]), fish, milk, eggs, yogurt, peanuts, peanut butter, and beans. Include a protein-containing food at each meal. Document Released: 02/14/2005 Document Revised: 05/09/2011 Document Reviewed: 01/06/2011 [facility] Patient Information 2015 [place], [place]. This information is not intended to replace advice given to you by your health care provider. Make sure you discuss any questions you have with your health care provider.

## 2013-09-16 NOTE — Progress Notes (Signed)
Patient ID: Shawn Nguyen, male   DOB: [birth year], 58 y.o.   MRN: [id number]  CC: follow up  HPI: 58 year old male with past medical history of hepatitis, hypertension who presented to clinic for followup. He is doing well. He has GI physician he is following with as well. No current complaints.  No Known Allergies Past Medical History  Diagnosis Date  . Hepatitis   . HTN (hypertension)    Current Outpatient Prescriptions on File Prior to Visit  Medication Sig Dispense Refill  . furosemide (LASIX) 40 MG tablet Take 1 tablet (40 mg total) by mouth daily.  30 tablet  3  . potassium chloride SA (K-DUR,KLOR-CON) 20 MEQ tablet Take 1 tablet (20 mEq total) by mouth daily.  30 tablet  3  . spironolactone (ALDACTONE) 25 MG tablet Take 1 tablet (25 mg total) by mouth daily.  90 tablet  3   No current facility-administered medications on file prior to visit.   Family History  Problem Relation Age of Onset  . Diabetes Father    History   Social History  . Marital Status: Single    Spouse Name: N/A    Number of Children: 2  . Years of Education: N/A   Occupational History  . Not on file.   Social History Main Topics  . Smoking status: Current Every Day Smoker    Types: Cigarettes  . Smokeless tobacco: Never Used  . Alcohol Use: Yes     Comment: 2-4 drinks on a daily basis  . Drug Use: No     Comment: former 2005  . Sexual Activity: Not on file   Other Topics Concern  . 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    Review of Systems  Constitutional: Negative for fever, chills, diaphoresis, activity change, appetite change and fatigue.  HENT: Negative for ear pain, nosebleeds, congestion, facial swelling, rhinorrhea, neck pain, neck stiffness and ear discharge.   Eyes: Negative for pain, discharge, redness, itching and visual disturbance.  Respiratory: Negative for  cough, choking, chest tightness, shortness of breath, wheezing and stridor.   Cardiovascular: Negative for chest pain, palpitations and leg swelling.  Gastrointestinal: Negative for abdominal distention.  Genitourinary: Negative for dysuria, urgency, frequency, hematuria, flank pain, decreased urine volume, difficulty urinating and dyspareunia.  Musculoskeletal: Negative for back pain, joint swelling, arthralgias and gait problem.  Neurological: Negative for dizziness, tremors, seizures, syncope, facial asymmetry, speech difficulty, weakness, light-headedness, numbness and headaches.  Hematological: Negative for adenopathy. Does not bruise/bleed easily.  Psychiatric/Behavioral: Negative for hallucinations, behavioral problems, confusion, dysphoric mood, decreased concentration and agitation.    Objective:   Filed Vitals:   09/16/13 0940  BP: 133/85  Pulse: 60  Temp: 98.4 F (36.9 C)  Resp: 16    Physical Exam  Constitutional: Appears well-developed and well-nourished. No distress.  HENT: Normocephalic. External right and left ear normal. Oropharynx is clear and moist.  Eyes: Conjunctivae and EOM are normal. PERRLA, no scleral icterus.  Neck: Normal ROM. Neck supple. No JVD. No tracheal deviation. No thyromegaly.  CVS: RRR, S1/S2 +, no murmurs, no gallops, no carotid bruit.  Pulmonary: Effort and breath sounds normal, no stridor, rhonchi, wheezes, rales.  Abdominal: Soft. BS +,  no distension, tenderness, rebound or guarding.  Musculoskeletal: Normal range of motion. No edema and no tenderness.  Lymphadenopathy: No lymphadenopathy noted, cervical, inguinal. Neuro: Alert. Normal  reflexes, muscle tone coordination. No cranial nerve deficit. Skin: Skin is warm and dry. No rash noted. Not diaphoretic. No erythema. No pallor.  Psychiatric: Normal mood and affect. Behavior, judgment, thought content normal.   Lab Results  Component Value Date   WBC 4.8 05/06/2013   HGB 13.5 05/06/2013    HCT 38.2* 05/06/2013   MCV 84.1 05/06/2013   PLT 195 05/06/2013   Lab Results  Component Value Date   CREATININE 0.6 08/05/2013   BUN 5* 08/05/2013   NA 135 08/05/2013   K 4.2 08/05/2013   CL 99 08/05/2013   CO2 28 08/05/2013    Lab Results  Component Value Date   HGBA1C 5.0 05/18/2013   Lipid Panel     Component Value Date/Time   CHOL 108 05/24/2013 0906   TRIG 55 05/24/2013 0906   HDL 30* 05/24/2013 0906   CHOLHDL 3.6 05/24/2013 0906   VLDL 11 05/24/2013 0906   LDLCALC 67 05/24/2013 0906       Assessment and plan:   Patient Active Problem List   Diagnosis Date Noted  . Liver disease - Patient is doing well. Patient instructed to see her in 3 months. He is following with Dr.  Carlean Purl for liver issues as well. 09/08/2013

## 2013-09-17 ENCOUNTER — Telehealth: Payer: Self-pay | Admitting: *Deleted

## 2013-09-17 DIAGNOSIS — B1920 Unspecified viral hepatitis C without hepatic coma: Secondary | ICD-10-CM

## 2013-09-17 NOTE — Telephone Encounter (Signed)
Message copied by Hulan Saas on Tue Sep 17, 2013  1:09 PM ------      Message from: Willia Craze      Created: Tue Sep 17, 2013 12:26 PM       Hi Rollene Fare,      Can you please arrange for       HCV genotype, hepatic elastography, and referral to ID clinic for HCV Tx            Thanks,PG ------

## 2013-09-17 NOTE — Telephone Encounter (Signed)
Lab in EPIC. Scheduled elastography at [facility] radiology on 09/19/13 at 8:00 AM. NPO after midnight.(Alisha). Referral sent to ID. Left a message with Nada Libman at [phone] for appointment with ID. Patient's line is busy. Will try again later.

## 2013-09-17 NOTE — Telephone Encounter (Signed)
Left a message for patient to call me. 

## 2013-09-17 NOTE — Telephone Encounter (Signed)
Spoke with patient and gave patient recommendations. He will come for lab tomorrow. Gave him appointment date, time and instructions for ultrasound. Nada Libman will call him with appointment for infectious disease.

## 2013-09-19 ENCOUNTER — Ambulatory Visit (HOSPITAL_COMMUNITY): Payer: Medicaid Other

## 2013-09-30 ENCOUNTER — Other Ambulatory Visit: Payer: Medicaid Other

## 2013-09-30 ENCOUNTER — Ambulatory Visit: Payer: Medicaid Other

## 2013-09-30 DIAGNOSIS — B182 Chronic viral hepatitis C: Secondary | ICD-10-CM

## 2013-09-30 LAB — CBC WITH DIFFERENTIAL/PLATELET
Basophils Absolute: 0.1 10*3/uL (ref 0.0–0.1)
Basophils Relative: 1 % (ref 0–1)
Eosinophils Absolute: 0.1 10*3/uL (ref 0.0–0.7)
Eosinophils Relative: 1 % (ref 0–5)
HCT: 37.6 % — ABNORMAL LOW (ref 39.0–52.0)
HEMOGLOBIN: 13.6 g/dL (ref 13.0–17.0)
LYMPHS ABS: 3 10*3/uL (ref 0.7–4.0)
LYMPHS PCT: 50 % — AB (ref 12–46)
MCH: 30.8 pg (ref 26.0–34.0)
MCHC: 36.2 g/dL — ABNORMAL HIGH (ref 30.0–36.0)
MCV: 85.3 fL (ref 78.0–100.0)
MONOS PCT: 10 % (ref 3–12)
Monocytes Absolute: 0.6 10*3/uL (ref 0.1–1.0)
NEUTROS PCT: 38 % — AB (ref 43–77)
Neutro Abs: 2.2 10*3/uL (ref 1.7–7.7)
PLATELETS: 169 10*3/uL (ref 150–400)
RBC: 4.41 MIL/uL (ref 4.22–5.81)
RDW: 14.8 % (ref 11.5–15.5)
WBC: 5.9 10*3/uL (ref 4.0–10.5)

## 2013-09-30 LAB — IRON: IRON: 88 ug/dL (ref 42–165)

## 2013-10-01 LAB — HEPATITIS B SURFACE ANTIGEN: Hepatitis B Surface Ag: NEGATIVE

## 2013-10-01 LAB — ANA: Anti Nuclear Antibody(ANA): NEGATIVE

## 2013-10-01 LAB — HIV ANTIBODY (ROUTINE TESTING W REFLEX): HIV 1&2 Ab, 4th Generation: NONREACTIVE

## 2013-10-01 LAB — HEPATITIS B SURFACE ANTIBODY,QUALITATIVE: Hep B S Ab: NEGATIVE

## 2013-10-02 ENCOUNTER — Other Ambulatory Visit: Payer: Self-pay | Admitting: *Deleted

## 2013-10-02 ENCOUNTER — Emergency Department (HOSPITAL_COMMUNITY)
Admission: EM | Admit: 2013-10-02 | Discharge: 2013-10-02 | Disposition: A | Discharge status: Home / Self Care | Payer: Medicaid Other | Attending: Emergency Medicine | Admitting: Emergency Medicine

## 2013-10-02 ENCOUNTER — Encounter (HOSPITAL_COMMUNITY): Payer: Self-pay | Admitting: Emergency Medicine

## 2013-10-02 ENCOUNTER — Emergency Department (HOSPITAL_COMMUNITY): Payer: Medicaid Other

## 2013-10-02 DIAGNOSIS — L02219 Cutaneous abscess of trunk, unspecified: Secondary | ICD-10-CM | POA: Diagnosis not present

## 2013-10-02 DIAGNOSIS — K7030 Alcoholic cirrhosis of liver without ascites: Secondary | ICD-10-CM | POA: Insufficient documentation

## 2013-10-02 DIAGNOSIS — R188 Other ascites: Secondary | ICD-10-CM

## 2013-10-02 DIAGNOSIS — Z79899 Other long term (current) drug therapy: Secondary | ICD-10-CM | POA: Insufficient documentation

## 2013-10-02 DIAGNOSIS — F1010 Alcohol abuse, uncomplicated: Secondary | ICD-10-CM

## 2013-10-02 DIAGNOSIS — B182 Chronic viral hepatitis C: Secondary | ICD-10-CM | POA: Diagnosis not present

## 2013-10-02 DIAGNOSIS — F17200 Nicotine dependence, unspecified, uncomplicated: Secondary | ICD-10-CM | POA: Insufficient documentation

## 2013-10-02 DIAGNOSIS — L03319 Cellulitis of trunk, unspecified: Secondary | ICD-10-CM | POA: Diagnosis not present

## 2013-10-02 DIAGNOSIS — K429 Umbilical hernia without obstruction or gangrene: Secondary | ICD-10-CM | POA: Insufficient documentation

## 2013-10-02 DIAGNOSIS — I10 Essential (primary) hypertension: Secondary | ICD-10-CM | POA: Insufficient documentation

## 2013-10-02 DIAGNOSIS — K7031 Alcoholic cirrhosis of liver with ascites: Secondary | ICD-10-CM

## 2013-10-02 HISTORY — DX: Alcohol dependence, uncomplicated: F10.20

## 2013-10-02 HISTORY — DX: Calculus of gallbladder without cholecystitis without obstruction: K80.20

## 2013-10-02 HISTORY — DX: Umbilical hernia without obstruction or gangrene: K42.9

## 2013-10-02 HISTORY — DX: Unspecified viral hepatitis C without hepatic coma: B19.20

## 2013-10-02 LAB — ALBUMIN: ALBUMIN: 2.8 g/dL — AB (ref 3.5–5.2)

## 2013-10-02 MED ORDER — SPIRONOLACTONE 25 MG PO TABS: 100.0000 mg | ORAL_TABLET | Freq: Every day | ORAL | Status: DC

## 2013-10-02 MED ORDER — CEPHALEXIN 500 MG PO CAPS: 500.0000 mg | ORAL_CAPSULE | Freq: Four times a day (QID) | ORAL | Status: DC

## 2013-10-02 MED ORDER — SPIRONOLACTONE 50 MG PO TABS: 100.0000 mg | ORAL_TABLET | Freq: Every day | ORAL | Status: DC

## 2013-10-02 MED ORDER — SULFAMETHOXAZOLE-TRIMETHOPRIM 800-160 MG PO TABS: 2.0000 | ORAL_TABLET | Freq: Two times a day (BID) | ORAL | Status: DC

## 2013-10-02 NOTE — Progress Notes (Signed)
Patient scheduled for US guided paracentesis. Upon reviewing [place] images no ascites is seen, procedure cancelled.  Tsosie Billing PA-C Interventional Radiology  10/02/13  2:21 PM

## 2013-10-02 NOTE — Consult Note (Signed)
Patient seen, examined, and I agree with the above documentation, including the assessment and plan. The patient had abdominal ultrasound which did not show significant ascites. He does have an umbilical hernia but without tense ascites I do not think the weeping from the hernia is necessary related to intra-abdominal ascites. There is several erosions periumbilical he, query early cellulitis I recommended treatment with Bactrim double strength one twice daily x10 days for cellulitis Increase diuresis with Aldactone 100 mg and Lasix 40 mg daily, low sodium diet Basic metabolic panel within 1 week given change in diuretics Office followup as previously planned ETOH cessation again advised

## 2013-10-02 NOTE — ED Notes (Signed)
Pt to u/s

## 2013-10-02 NOTE — Consult Note (Signed)
[facility] Consult: 12:36 PM 10/02/2013  LOS: 0 days    Referring Provider: ED.  Dr Doy Mince.  Primary Care Physician:  Angelica Chessman, MD Primary Gastroenterologist:  Dr. Carlean Purl     Reason for Consultation:   Ascites leaking from umbilical hernia.   HPI: Shawn Nguyen is a 58 y.o. male.  Pt with ascites, presumed cirrhosis.  Alcoholism.  03/2013 ultrasound showed gallstones but not cirrhosis.  6 liter paracentesis 03/2013, 9 liter tap 04/2013, 5 liter tap 08/08/13. No SBP on 6/11 tap. Initial office consult with Dr Carlean Purl 08/05/13. Serologies confirmed he is Hep C +.  Viral load in [phone]. Referral to ID clinic mentioned, not yet arranged.   07/2013 echo to rule out heart failure as cause of ascites was normal.   Diuretics are Spironolactone 20 mg daily and Lasix 40 mg daily. On Potassium as well   09/08/13 seen by Tye Savoy GI NP in office for ascites draining from umbilical hernia. This had ceased and scab had formed on umbilicus by time of visit.  No meds changed.  HCV genotype, hepatic elastography, hep A/B vaccinations planned for next ROV in Sept 2015.  EGD and fecal elastography mentioned but not yet arranged.   Came to ED today for recurrent ascitic drainage from umbilicus. This began this AM.  Hernia has been chronically protruding but leaking only began this AM.  Protuberance and drainage worse if he is not laying down.  No abdominal pain.  Drainage is clear, no blood.  No vomiting, no fever.  Still drinks 40 oz beer per day, previously drank several 40s per day.  Smokes ~ 5 cigs per day.  No home.  Stays with various friends.      Past Medical History  Diagnosis Date  . Hepatitis C     tested + in 07/2013.   Marland Kitchen HTN (hypertension)   . Ascites 03/2013     6 liter paracentesis 03/2013, 9 liter  tap 04/2013, 5 liter tap 08/08/13. No SBP on 6/11 tap.  Marland Kitchen Alcoholism   . Gallstones 03/2013    on ultrasound.   Marland Kitchen Umbilical hernia 06/9161    draining ascites    History reviewed. No pertinent past surgical history.  Prior to Admission medications   Medication Sig Start Date End Date Taking? Authorizing Provider  furosemide (LASIX) 40 MG tablet Take 1 tablet (40 mg total) by mouth daily. 06/14/13  Yes Lorayne Marek, MD  potassium chloride SA (K-DUR,KLOR-CON) 20 MEQ tablet Take 1 tablet (20 mEq total) by mouth daily. 06/14/13  Yes Lorayne Marek, MD  spironolactone (ALDACTONE) 25 MG tablet Take 1 tablet (25 mg total) by mouth daily. 06/14/13  Yes Lorayne Marek, MD    Scheduled Meds:  Infusions:  PRN Meds:      Allergies as of 10/02/2013  . (No Known Allergies)    Family History  Problem Relation Age of Onset  . Diabetes Father     History   Social History  . Marital Status: Single    Spouse Name: N/A    Number of  Children: 2  . Years of Education: N/A   Occupational History  . Not on file.   Social History Main Topics  . Smoking status: Current Every Day Smoker    Types: Cigarettes  . Smokeless tobacco: Never Used  . Alcohol Use: Yes     Comment: 2-4 drinks on a daily basis  . Drug Use: No     Comment: former 2005  . Sexual Activity: Not on file   Other Topics Concern  . No problems reading, uses reading glasses.    Social History Narrative   single, 1 son one daughter, formerly worked as a Biochemist, clinical, he has been unemployed for a number of years.    REVIEW OF SYSTEMS: Constitutional:  No weight loss or gain ENT:  No nose bleeds Pulm:  No dyspnea.  Since previous paracentesis, DOE has resolved CV:  No palpitations, no LE edema.  GU:  No hematuria, no frequency GI:  Per HPI.   Heme:  No unusual bleeding   Transfusions:  none Neuro:  No headaches, no peripheral tingling or numbness Derm:  No itching, no rash or sores.  Endocrine:  No sweats or  chills.  No polyuria or dysuria Immunization:  No yet vaccinated for hep A or B.  Travel:  None beyond local counties in last few months.    PHYSICAL EXAM: Vital signs in last 24 hours: Filed Vitals:   10/02/13 1145  BP: 123/76  Pulse: 82  Temp:   Resp: 14   Wt Readings from Last 3 Encounters:  10/02/13 72.122 kg (159 lb)  09/16/13 70.308 kg (155 lb)  09/06/13 70.489 kg (155 lb 6.4 oz)   General: pleasant, comfortable.  NAD Head:  No asymmetry or swelling  Eyes:  No icterus or pallor Ears:  Not HOH  Nose:  No congestion or discharge Mouth:  Clear, moist.  Poor dentition.  Lots of teeth gone Neck:  No mass, no JVD Lungs:  Clear bil.   Heart: RRR.  No MRG. S1 and S2 present Abdomen:  Soft, minor distention.  No HSM.  Umbilical hernia is soft and not actively draining with pt in recllined pzn.  As he sits up the hernia pops out.  Some scabs visible on hernia.  Small,  non-tender blisters to the right of the hernia.   Rectal: deferred   Musc/Skeltl: no joint pain or swelling Extremities:  No pedal edema  Neurologic:  Oriented x 3.  No asterixis.  No limb weakness Skin:  No rash, no sores.  Just periumbilical blisters Tattoos:  None seen Nodes:  No cervical adenopathy.    Psych:  Pleasant, cooperative.   Intake/Output from previous day:   Intake/Output this shift:    LAB RESULTS:  Recent Labs  09/30/13 1522  WBC 5.9  HGB 13.6  HCT 37.6*  PLT 169   BMET Lab Results  Component Value Date   NA 135 08/05/2013   NA 132* 06/17/2013   NA 134* 05/24/2013   K 4.2 08/05/2013   K 4.9 06/17/2013   K 4.2 05/24/2013   CL 99 08/05/2013   CL 96 06/17/2013   CL 100 05/24/2013   CO2 28 08/05/2013   CO2 29 06/17/2013   CO2 28 05/24/2013   GLUCOSE 83 08/05/2013   GLUCOSE 73 06/17/2013   GLUCOSE 70 05/24/2013   BUN 5* 08/05/2013   BUN 8 06/17/2013   BUN 8 05/24/2013   CREATININE 0.6 08/05/2013   CREATININE 0.72 06/17/2013   CREATININE 0.87  05/24/2013   CALCIUM 9.0 08/05/2013   CALCIUM 8.6  06/17/2013   CALCIUM 8.4 05/24/2013   LFT No results found for this basename: PROT, ALBUMIN, AST, ALT, ALKPHOS, BILITOT, BILIDIR, IBILI,  in the last 72 hours PT/INR Lab Results  Component Value Date   INR 1.3* 08/05/2013       PT                          14.8  Lipase     Component Value Date/Time   LIPASE 29 04/22/2013 0921    ENDOSCOPIC STUDIES: None yet  IMPRESSION:   *  Ascites leaking from umbilical hernia. S/p 3 paracentesis of 5 - 9 liters since 03/2013.  On fairly low doses of diuretics.   *  Presumed cirrhosis, though none seen on ultrasound in 03/2013.  *  Hepatitis C +.  Untreated. Genotype studies planned   *  Alcoholism.    PLAN:     *  Tap ascites to reduce pressures and hopefully slow/stop leakage.  Send studies for cell count/diff, albumin.  Serum albumin  So we can calculate SAG. *  Increase aldactone to 100 mg, Lasix stays at 40 mg daily. Stop Potassium.    *  BMET next week to assess any negative impact of diuretics on renal function, potassium level. Orders placed.    Azucena Freed  10/02/2013, 12:36 PM Pager: [phone]

## 2013-10-02 NOTE — ED Notes (Signed)
Abdominal binder placed on pt with teaching done pt verbalized understanding

## 2013-10-02 NOTE — ED Provider Notes (Signed)
CSN: [id number]     Arrival date & time 10/02/13  1040 History   First MD Initiated Contact with Patient 10/02/13 1117     Chief Complaint  Patient presents with  . Hernia    Leaking     (Consider location/radiation/quality/duration/timing/severity/associated sxs/prior Treatment) HPI Comments: 58 yo male with hx of hepatitis and ascites presenting with spontaneous leaking from his umbilical hernia.  Sudden onset about an hour PTA.  Large amount of leaking.  Fluid is thin and straw colored.  Leaking is intermittent.  Better when lying flat, worse when standing.  No abdominal pain, fevers, nausea, vomiting, or peripheral edema.     Past Medical History  Diagnosis Date  . Hepatitis   . HTN (hypertension)    History reviewed. No pertinent past surgical history. Family History  Problem Relation Age of Onset  . Diabetes Father    History  Substance Use Topics  . Smoking status: Current Every Day Smoker    Types: Cigarettes  . Smokeless tobacco: Never Used  . Alcohol Use: Yes     Comment: 2-4 drinks on a daily basis    Review of Systems  All other systems reviewed and are negative.     Allergies  Review of patient's allergies indicates no known allergies.  Home Medications   Prior to Admission medications   Medication Sig Start Date End Date Taking? Authorizing Provider  furosemide (LASIX) 40 MG tablet Take 1 tablet (40 mg total) by mouth daily. 06/14/13  Yes Lorayne Marek, MD  potassium chloride SA (K-DUR,KLOR-CON) 20 MEQ tablet Take 1 tablet (20 mEq total) by mouth daily. 06/14/13  Yes Lorayne Marek, MD  spironolactone (ALDACTONE) 25 MG tablet Take 1 tablet (25 mg total) by mouth daily. 06/14/13  Yes Deepak Advani, MD   BP 105/73  Pulse 83  Temp(Src) 97.8 F (36.6 C) (Oral)  Resp 17  Ht 6\' 2"  (1.88 m)  Wt 159 lb (72.122 kg)  BMI 20.41 kg/m2  SpO2 98% Physical Exam  Nursing note and vitals reviewed. Constitutional: He is oriented to person, place, and time. He  appears well-developed and well-nourished. No distress.  HENT:  Head: Normocephalic and atraumatic.  Eyes: Conjunctivae are normal. No scleral icterus.  Neck: Neck supple.  Cardiovascular: Normal rate and intact distal pulses.   Pulmonary/Chest: Effort normal. No stridor. No respiratory distress.  Abdominal: Normal appearance. He exhibits no distension. There is no tenderness. There is no rigidity, no rebound and no guarding.  Umbilical hernia soft and flaccid.  Mild excoriation on roof of hernia.   Musculoskeletal: He exhibits no edema.  Neurological: He is alert and oriented to person, place, and time.  Skin: Skin is warm and dry. No rash noted.  Psychiatric: He has a normal mood and affect. His behavior is normal.    ED Course  Procedures (including critical care time) Labs Review Labs Reviewed  ALBUMIN  ALBUMIN, FLUID  BODY FLUID CELL COUNT WITH DIFFERENTIAL    Imaging Review US Abdomen Limited  10/02/2013   CLINICAL DATA:  Evaluate for possible ascites, hepatitis-C  EXAM: LIMITED ABDOMEN ULTRASOUND FOR ASCITES  TECHNIQUE: Limited ultrasound survey for ascites was performed in all four abdominal quadrants.  COMPARISON:  08/08/2013  FINDINGS: Small amount of ascites is identified to predominantly in the left lower quadrant, insufficient for paracentesis based on real-time review by Tsosie Billing, PA.  IMPRESSION: Small ascites.  No paracentesis performed.   Electronically Signed   By: Conchita Paris M.D.   On: 10/02/2013  14:16     EKG Interpretation None      MDM   Final diagnoses:  Ascites    58 yo male with liver disease and ascites presenting with leaking ascites through umbilical hernia.  No abd pain.  No fevers.  Consulted GI who recommends paracentesis to rule out infection.  If negative, they have arranged medication changes and outpatient follow up.    US obtained and did not show enough ascites to drain.  Plan abd binder and dc with follow up.    GI requested  abx to cover possible developing cellulitis at leakage site.    Houston Siren III, MD 10/02/13 [phone]

## 2013-10-02 NOTE — ED Notes (Signed)
Pt undressed, in gown, on monitor, continuous pulse oximetry and blood pressure cuff; pt has abd pad up against his hernia that is presently leaking fluid at this time

## 2013-10-02 NOTE — ED Notes (Signed)
58 yo male presents via EMS with Umbilical Hernia that began to Shawn Nguyen today while walking. Pt reports fluid was scant and yellow. Has stopped when presenting. Denies any bleeding, LOC or pain. Denies N/V/D. Vitals stable with EMS.

## 2013-10-02 NOTE — Discharge Instructions (Signed)
Ascites °Ascites is a gathering of fluid in the belly (abdomen). This is most often caused by liver disease. It may also be caused by a number of other less common problems. It causes a ballooning out (distension) of the abdomen. °CAUSES  °Scarring of the liver (cirrhosis) is the most common cause of ascites. Other causes include: °· Infection or inflammation in the abdomen. °· Cancer in the abdomen. °· Heart failure. °· Certain forms of kidney failure (nephritic syndrome). °· Inflammation of the pancreas. °· Clots in the veins of the liver. °SYMPTOMS  °In the early stages of ascites, you may not have any symptoms. The main symptom of ascites is a sense of abdominal bloating. This is due to the presence of fluid. This may also cause an increase in abdominal or waist size. People with this condition can develop swelling in the legs, and men can develop a swollen scrotum. When there is a lot of fluid, it may be hard to breath. Stretching of the abdomen by fluid can be painful. °DIAGNOSIS  °Certain features of your medical history, such as a history of liver disease and of an enlarging abdomen, can suggest the presence of ascites. The diagnosis of ascites can be made on physical exam by your caregiver. An abdominal ultrasound examination can confirm that ascites is present, and estimate the amount of fluid. °Once ascites is confirmed, it is important to determine its cause. Again, a history of one of the conditions listed in "CAUSES" provides a strong clue. A physical exam is important, and blood and X-ray tests may be needed. During a procedure called paracentesis, a sample of fluid is removed from the abdomen. This can determine certain key features about the fluid, such as whether or not infection or cancer is present. Your caregiver will determine if a paracentesis is necessary. They will describe the procedure to you. °PREVENTION  °Ascites is a complication of other conditions. Therefore to prevent ascites, you  must seek treatment for any significant health conditions you have. Once ascites is present, careful attention to fluid and salt intake may help prevent it from getting worse. If you have ascites, you should not drink alcohol. °PROGNOSIS  °The prognosis of ascites depends on the underlying disease. If the disease is reversible, such as with certain infections or with heart failure, then ascites may improve or disappear. When ascites is caused by cirrhosis, then it indicates that the liver disease has worsened, and further evaluation and treatment of the liver disease is needed. If your ascites is caused by cancer, then the success or failure of the cancer treatment will determine whether your ascites will improve or worsen. °RISKS AND COMPLICATIONS  °Ascites is likely to worsen if it is not properly diagnosed and treated. A large amount of ascites can cause pain and difficulty breathing. The main complication, besides worsening, is infection (called spontaneous bacterial peritonitis). This requires prompt treatment. °TREATMENT  °The treatment of ascites depends on its cause. When liver disease is your cause, medical management using water pills (diuretics) and decreasing salt intake is often effective. Ascites due to peritoneal inflammation or malignancy (cancer) alone does not respond to salt restriction and diuretics. Hospitalization is sometimes required. °If the treatment of ascites cannot be managed with medications, a number of other treatments are available. Your caregivers will help you decide which will work best for you. Some of these are: °· Removal of fluid from the abdomen (paracentesis). °· Fluid from the abdomen is passed into a vein (peritoneovenous shunting). °·   Liver transplantation. °· Transjugular intrahepatic portosystemic stent shunt. °HOME CARE INSTRUCTIONS  °It is important to monitor body weight and the intake and output of fluids. Weigh yourself at the same time every day. Record your  weights. Fluid restriction may be necessary. It is also important to know your salt intake. The more salt you take in, the more fluid you will retain. Ninety percent of people with ascites respond to this approach. °· Follow any directions for medicines carefully. °· Follow up with your caregiver, as directed. °· Report any changes in your health, especially any new or worsening symptoms. °· If your ascites is from liver disease, avoid alcohol and other substances toxic to the liver. °SEEK MEDICAL CARE IF:  °· Your weight increases more than a few pounds in a few days. °· Your abdominal or waist size increases. °· You develop swelling in your legs. °· You had swelling and it worsens. °SEEK IMMEDIATE MEDICAL CARE IF:  °· You develop a fever. °· You develop new abdominal pain. °· You develop difficulty breathing. °· You develop confusion. °· You have bleeding from the mouth, stomach, or rectum. °MAKE SURE YOU:  °· Understand these instructions. °· Will watch your condition. °· Will get help right away if you are not doing well or get worse. °Document Released: 02/14/2005 Document Revised: 05/09/2011 Document Reviewed: 09/15/2006 °ExitCare® Patient Information ©2015 [employer]. This information is not intended to replace advice given to you by your health care provider. Make sure you discuss any questions you have with your health care provider. ° °

## 2013-10-02 NOTE — ED Notes (Signed)
Pt returned from u/s

## 2013-10-02 NOTE — ED Notes (Signed)
Pt to specials. 

## 2013-10-03 LAB — HEPATITIS C GENOTYPE

## 2013-11-07 ENCOUNTER — Ambulatory Visit: Payer: Medicaid Other | Admitting: Internal Medicine

## 2013-11-12 ENCOUNTER — Ambulatory Visit (INDEPENDENT_AMBULATORY_CARE_PROVIDER_SITE_OTHER): Payer: Medicaid Other | Admitting: Internal Medicine

## 2013-11-12 ENCOUNTER — Encounter: Payer: Self-pay | Admitting: Internal Medicine

## 2013-11-12 VITALS — BP 151/99 | HR 85 | Temp 98.3°F | Ht 74.0 in | Wt 163.0 lb

## 2013-11-12 DIAGNOSIS — Z23 Encounter for immunization: Secondary | ICD-10-CM

## 2013-11-12 DIAGNOSIS — K7460 Unspecified cirrhosis of liver: Secondary | ICD-10-CM

## 2013-11-12 DIAGNOSIS — B182 Chronic viral hepatitis C: Secondary | ICD-10-CM

## 2013-11-12 NOTE — Progress Notes (Signed)
+Shawn Nguyen is a 58 y.o. male who presents for initial evaluation and management of a positive Hepatitis C antibody test.  Patient tested positive this year. Hepatitis C risk factors present are: IV drug abuse (details: many years ago). Patient denies IV drug abuse. Patient has had other studies performed. Results: hepatitis C RNA by PCR, result: positive. Patient has not had prior treatment for Hepatitis C. Patient does have a past history of liver disease. Patient does not have a family history of liver disease.   HPI: He has ascites and work up revealed active chronic hepatitis C.  Also significant alcohol abuse.  Presumed cirrhosis despite normal platelets.  Has low albumin and INR 1.3.    Patient does not have documented immunity to Hepatitis A. Patient does not have documented immunity to Hepatitis B.     Review of Systems A comprehensive review of systems was negative.   Past Medical History  Diagnosis Date  . Hepatitis C     tested + in 07/2013.   Marland Kitchen HTN (hypertension)   . Ascites 03/2013     6 liter paracentesis 03/2013, 9 liter tap 04/2013, 5 liter tap 08/08/13. No SBP on 6/11 tap.  Marland Kitchen Alcoholism   . Gallstones 03/2013    on ultrasound.   Marland Kitchen Umbilical hernia 05/4008    draining ascites    Prior to Admission medications   Medication Sig Start Date End Date Taking? Authorizing Provider  cephALEXin (KEFLEX) 500 MG capsule Take 1 capsule (500 mg total) by mouth 4 (four) times daily. 10/02/13  Yes Houston Siren III, MD  spironolactone (ALDACTONE) 50 MG tablet Take 2 tablets (100 mg total) by mouth daily. 10/02/13  Yes Houston Siren III, MD  sulfamethoxazole-trimethoprim (SEPTRA DS) 800-160 MG per tablet Take 2 tablets by mouth 2 (two) times daily. 10/02/13  Yes Arbie Cookey, MD    No Known Allergies  History  Substance Use Topics  . Smoking status: Current Every Day Smoker -- 0.25 packs/day    Types: Cigarettes  . Smokeless tobacco: Never Used     Comment: trying  to cut back  . Alcohol Use: Yes     Comment: 2-4 drinks on a daily basis    Family History  Problem Relation Age of Onset  . Diabetes Father       Objective:   Filed Vitals:   11/12/13 1358  BP: 151/99  Pulse: 85  Temp: 98.3 F (36.8 C)   in no apparent distress and alert HEENT: anicteric Cor Tachy and No murmurs clear Bowel sounds are normal has umbilical hernia, ascites and taught abdomen peripheral pulses normal, no pedal edema, no clubbing or cyanosis negative for - jaundice, spider hemangioma, telangiectasia, palmar erythema, ecchymosis and atrophy  Laboratory Genotype:  Lab Results  Component Value Date   HCVGENOTYPE 1a 09/30/2013   HCV viral load:  Lab Results  Component Value Date   HCVQUANT 272536* 08/05/2013   Lab Results  Component Value Date   WBC 5.9 09/30/2013   HGB 13.6 09/30/2013   HCT 37.6* 09/30/2013   MCV 85.3 09/30/2013   PLT 169 09/30/2013    Lab Results  Component Value Date   CREATININE 0.6 08/05/2013   BUN 5* 08/05/2013   NA 135 08/05/2013   K 4.2 08/05/2013   CL 99 08/05/2013   CO2 28 08/05/2013    Lab Results  Component Value Date   ALT 15 06/17/2013   AST 42* 06/17/2013   ALKPHOS 145* 06/17/2013  BILITOT 1.0 06/17/2013   INR 1.3* 08/05/2013      Assessment: Hepatitis C genotype 1a  Plan: 1) Patient counseled extensively on limiting acetaminophen to no more than 2 grams daily, avoidance of alcohol. 2) Transmission discussed with patient including sexual transmission, sharing razors and toothbrush.   3) Is seeing GI for cirrhosis management.  Is C-P class B.  Will also confirm with elastography.   4) Will need referral for substance abuse counseling: Yes.  i discussed at length that alcohol cessation is most important.  5) Will prescribe Harvoni if he completes substance abuse counseling and remains alcohol free.   6) Hepatitis A vaccine Yes.   7) Hepatitis B vaccine Yes.   8) Pneumovax vaccine given with presumed cirrhosis. 9) will follow up  after starting medication or in 1 year if denied

## 2013-11-15 ENCOUNTER — Telehealth: Payer: Self-pay | Admitting: *Deleted

## 2013-11-15 NOTE — Telephone Encounter (Signed)
Patient notified of appointment for ultrasound at [facility] on 11/25/13 at 8:45 AM. He is aware nothing to eat or drink after midnight. Myrtis Hopping

## 2013-11-20 ENCOUNTER — Ambulatory Visit (INDEPENDENT_AMBULATORY_CARE_PROVIDER_SITE_OTHER): Payer: Medicaid Other | Admitting: Internal Medicine

## 2013-11-20 ENCOUNTER — Encounter: Payer: Self-pay | Admitting: Internal Medicine

## 2013-11-20 ENCOUNTER — Other Ambulatory Visit (INDEPENDENT_AMBULATORY_CARE_PROVIDER_SITE_OTHER): Payer: Medicaid Other

## 2013-11-20 VITALS — BP 122/70 | HR 88 | Ht 72.75 in | Wt 166.0 lb

## 2013-11-20 DIAGNOSIS — K7031 Alcoholic cirrhosis of liver with ascites: Secondary | ICD-10-CM

## 2013-11-20 DIAGNOSIS — F1010 Alcohol abuse, uncomplicated: Secondary | ICD-10-CM

## 2013-11-20 DIAGNOSIS — Z1211 Encounter for screening for malignant neoplasm of colon: Secondary | ICD-10-CM

## 2013-11-20 DIAGNOSIS — R188 Other ascites: Secondary | ICD-10-CM

## 2013-11-20 DIAGNOSIS — K429 Umbilical hernia without obstruction or gangrene: Secondary | ICD-10-CM

## 2013-11-20 DIAGNOSIS — K7030 Alcoholic cirrhosis of liver without ascites: Secondary | ICD-10-CM

## 2013-11-20 LAB — BASIC METABOLIC PANEL
BUN: 5 mg/dL — ABNORMAL LOW (ref 6–23)
CHLORIDE: 97 meq/L (ref 96–112)
CO2: 26 mEq/L (ref 19–32)
Calcium: 8.7 mg/dL (ref 8.4–10.5)
Creatinine, Ser: 0.7 mg/dL (ref 0.4–1.5)
GFR: 159.71 mL/min (ref >60.00)
Glucose, Bld: 82 mg/dL (ref 70–99)
POTASSIUM: 4.5 meq/L (ref 3.5–5.1)
Sodium: 130 mEq/L — ABNORMAL LOW (ref 135–145)

## 2013-11-20 MED ORDER — SPIRONOLACTONE 50 MG PO TABS: 100.0000 mg | ORAL_TABLET | Freq: Every day | ORAL | Status: DC

## 2013-11-20 MED ORDER — NA SULFATE-K SULFATE-MG SULF 17.5-3.13-1.6 GM/177ML PO SOLN: 1.0000 | Freq: Once | ORAL | Status: DC

## 2013-11-20 NOTE — Patient Instructions (Signed)
You have been scheduled for an endoscopy and colonoscopy. Please follow the written instructions given to you at your visit today. Please pick up your prep at the pharmacy within the next 1-3 days. If you use inhalers (even only as needed), please bring them with you on the day of your procedure. Your physician has requested that you go to www.startemmi.com and enter the access code given to you at your visit today. This web site gives a general overview about your procedure. However, you should still follow specific instructions given to you by our office regarding your preparation for the procedure.  Your physician has requested that you go to the basement for the following lab work before leaving today: BMET  We have given you a printed prescription for spironolactone.

## 2013-11-20 NOTE — Assessment & Plan Note (Addendum)
Varices screening with EGD will be scheduled The risks and benefits as well as alternatives of endoscopic procedure(s) have been discussed and reviewed. All questions answered. The patient agrees to proceed.

## 2013-11-20 NOTE — Progress Notes (Signed)
   Subjective:    Patient ID: Shawn Nguyen, male    DOB: 12-Apr-1955, 58 y.o.   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 Stopping EtOH Last alcohol 3 days ago on sun "[employer] performance made me". He is pursuing alcohol counseling. Umbilical hernia leaked but is stopping, he was seen in the emergency department a couple times had a paracentesis and had antibiotics prescribed prophylactically for the leaking umbilical hernia. Had vaccines started in ID Being considered for HCV Tx by infectious disease clinic No Known Allergies Outpatient Prescriptions Prior to Visit  Medication Sig Dispense Refill  . cephALEXin (KEFLEX) 500 MG capsule Take 1 capsule (500 mg total) by mouth 4 (four) times daily.  40 capsule  0  . spironolactone (ALDACTONE) 50 MG tablet Take 2 tablets (100 mg total) by mouth daily.  60 tablet  1  . sulfamethoxazole-trimethoprim (SEPTRA DS) 800-160 MG per tablet Take 2 tablets by mouth 2 (two) times daily.  40 tablet  0   No facility-administered medications prior to visit.   Past Medical History  Diagnosis Date  . Hepatitis C     tested + in 07/2013.   Marland Kitchen HTN (hypertension)   . Ascites 03/2013     6 liter paracentesis 03/2013, 9 liter tap 04/2013, 5 liter tap 08/08/13. No SBP on 6/11 tap.  Marland Kitchen Alcoholism   . Gallstones 03/2013    on ultrasound.   Marland Kitchen Umbilical hernia 09/1015    draining ascites   History reviewed. No pertinent past surgical history. History   Social History  . Marital Status: Single    Spouse Name: N/A    Number of Children: 2  . Years of Education: N/A   Social History Main Topics  . Smoking status: Current Every Day Smoker -- 0.25 packs/day    Types: Cigarettes  . Smokeless tobacco: Never Used     Comment: trying to cut back  . Alcohol Use: Yes     Comment: 2-4 drinks on a daily basis - reducing   . Drug Use: No     Comment: former 79   Social History Narrative   single, 1 son one  daughter, formerly worked as a Biochemist, clinical, he has been unemployed for a number of years.   Family History  Problem Relation Age of Onset  . Diabetes Father    Review of Systems Overall things are improving he is feeling better, he is drawing his so security and is awaiting placement in apartment. He is currently living in a motel.    Objective:   Physical Exam General:  NAD Eyes:   anicteric Lungs:  clear Abdomen:  soft and nontender, BS+, umbilical hernia reduced, site is flat and scarred, probable small ascites Ext:   no edema    Data Reviewed:  Labs, infectious disease Notes.     Assessment & Plan:  Cirrhosis Varices screening with EGD will be scheduled The risks and benefits as well as alternatives of endoscopic procedure(s) have been discussed and reviewed. All questions answered. The patient agrees to proceed.   Ascites Continue diuretics Check labs ? Add back furosemide  Special screening for malignant neoplasms, colon Schedule colonoscopy The risks and benefits as well as alternatives of endoscopic procedure(s) have been discussed and reviewed. All questions answered. The patient agrees to proceed.   Umbilical hernia without obstruction and without gangrene improved  Alcohol abuse Improved Pursuing counseling Encouraged cessation

## 2013-11-22 DIAGNOSIS — Z1211 Encounter for screening for malignant neoplasm of colon: Secondary | ICD-10-CM | POA: Insufficient documentation

## 2013-11-22 DIAGNOSIS — K429 Umbilical hernia without obstruction or gangrene: Secondary | ICD-10-CM | POA: Insufficient documentation

## 2013-11-22 NOTE — Assessment & Plan Note (Signed)
Schedule colonoscopy The risks and benefits as well as alternatives of endoscopic procedure(s) have been discussed and reviewed. All questions answered. The patient agrees to proceed.  

## 2013-11-22 NOTE — Assessment & Plan Note (Signed)
Continue diuretics Check labs ? Add back furosemide

## 2013-11-22 NOTE — Assessment & Plan Note (Signed)
Improved Pursuing counseling Encouraged cessation

## 2013-11-22 NOTE — Assessment & Plan Note (Signed)
improved

## 2013-11-25 ENCOUNTER — Other Ambulatory Visit: Payer: Self-pay

## 2013-11-25 DIAGNOSIS — E871 Hypo-osmolality and hyponatremia: Secondary | ICD-10-CM

## 2013-11-25 NOTE — Progress Notes (Signed)
Quick Note:  Na down some but acceptable Have him repeat the BMET in 2 weeks from the order date of this  Dx hyponatremia ______

## 2013-11-26 ENCOUNTER — Ambulatory Visit: Payer: Medicaid Other

## 2013-11-26 ENCOUNTER — Ambulatory Visit (HOSPITAL_COMMUNITY)
Admission: RE | Admit: 2013-11-26 | Discharge: 2013-11-26 | Disposition: A | Discharge status: Home / Self Care | Payer: Medicaid Other | Source: Ambulatory Visit | Attending: Internal Medicine | Admitting: Internal Medicine

## 2013-11-26 DIAGNOSIS — K7460 Unspecified cirrhosis of liver: Secondary | ICD-10-CM | POA: Insufficient documentation

## 2013-11-26 DIAGNOSIS — B182 Chronic viral hepatitis C: Secondary | ICD-10-CM | POA: Diagnosis not present

## 2013-11-26 DIAGNOSIS — B1920 Unspecified viral hepatitis C without hepatic coma: Secondary | ICD-10-CM | POA: Diagnosis present

## 2013-11-26 DIAGNOSIS — R188 Other ascites: Secondary | ICD-10-CM | POA: Diagnosis not present

## 2013-11-26 DIAGNOSIS — K8020 Calculus of gallbladder without cholecystitis without obstruction: Secondary | ICD-10-CM | POA: Diagnosis not present

## 2013-12-17 ENCOUNTER — Ambulatory Visit (INDEPENDENT_AMBULATORY_CARE_PROVIDER_SITE_OTHER): Payer: Medicaid Other | Admitting: Internal Medicine

## 2013-12-17 ENCOUNTER — Encounter: Payer: Self-pay | Admitting: Internal Medicine

## 2013-12-17 VITALS — BP 137/92 | HR 80 | Temp 98.0°F | Wt 164.0 lb

## 2013-12-17 DIAGNOSIS — K7460 Unspecified cirrhosis of liver: Principal | ICD-10-CM

## 2013-12-17 DIAGNOSIS — Z23 Encounter for immunization: Secondary | ICD-10-CM | POA: Diagnosis not present

## 2013-12-17 DIAGNOSIS — B182 Chronic viral hepatitis C: Secondary | ICD-10-CM | POA: Diagnosis present

## 2013-12-17 NOTE — Assessment & Plan Note (Addendum)
Needs to get into counseling and abstain from alcohol before he can sign readiness form required by medicaid.  I explained to him that abstaining from alcohol is the most important aspect.  He will reschedule with our substance abuse counselor.  I will see him again in a few months to see how he is progressing with that.

## 2013-12-17 NOTE — Addendum Note (Signed)
Addended by: Myrtis Hopping A on: 12/17/2013 02:01 PM   Modules accepted: Orders

## 2013-12-17 NOTE — Progress Notes (Signed)
   Subjective:    Patient ID: Shawn Nguyen, male    DOB: 11-Oct-1955, 58 y.o.   MRN: [id number]  HPI Here for follow up of HCV.  He has genotype 1a, cirrhosis with CP class B.  He has greatly decreased his drinking though has not yet followed up with the substance abuse counselor.  He has ascites and did require a paracentesis in the ED.  Has an umbilical hernia. Also trying to quit smoking.  Hep B #2 due today.     Review of Systems  Constitutional: Negative for fatigue.  Gastrointestinal: Negative for diarrhea.  Neurological: Negative for dizziness.       Objective:   Physical Exam  Constitutional: He appears well-developed.  Eyes: No scleral icterus.  Cardiovascular: Normal rate, regular rhythm and normal heart sounds.   No murmur heard. Lymphadenopathy:    He has no cervical adenopathy.          Assessment & Plan:

## 2013-12-24 ENCOUNTER — Ambulatory Visit: Payer: Medicaid Other

## 2013-12-25 ENCOUNTER — Encounter (HOSPITAL_COMMUNITY): Payer: Self-pay | Admitting: *Deleted

## 2013-12-25 ENCOUNTER — Encounter (HOSPITAL_COMMUNITY)
Admission: RE | Disposition: A | Discharge status: Home / Self Care | Payer: Self-pay | Source: Ambulatory Visit | Attending: Internal Medicine

## 2013-12-25 ENCOUNTER — Ambulatory Visit (HOSPITAL_COMMUNITY)
Admission: RE | Admit: 2013-12-25 | Discharge: 2013-12-25 | Disposition: A | Discharge status: Home / Self Care | Payer: Medicaid Other | Source: Ambulatory Visit | Attending: Internal Medicine | Admitting: Internal Medicine

## 2013-12-25 DIAGNOSIS — I868 Varicose veins of other specified sites: Secondary | ICD-10-CM | POA: Insufficient documentation

## 2013-12-25 DIAGNOSIS — D123 Benign neoplasm of transverse colon: Secondary | ICD-10-CM | POA: Diagnosis not present

## 2013-12-25 DIAGNOSIS — I8510 Secondary esophageal varices without bleeding: Secondary | ICD-10-CM | POA: Insufficient documentation

## 2013-12-25 DIAGNOSIS — D120 Benign neoplasm of cecum: Secondary | ICD-10-CM | POA: Insufficient documentation

## 2013-12-25 DIAGNOSIS — K7030 Alcoholic cirrhosis of liver without ascites: Secondary | ICD-10-CM | POA: Diagnosis not present

## 2013-12-25 DIAGNOSIS — B182 Chronic viral hepatitis C: Secondary | ICD-10-CM | POA: Insufficient documentation

## 2013-12-25 DIAGNOSIS — I10 Essential (primary) hypertension: Secondary | ICD-10-CM | POA: Insufficient documentation

## 2013-12-25 DIAGNOSIS — K2210 Ulcer of esophagus without bleeding: Secondary | ICD-10-CM | POA: Diagnosis present

## 2013-12-25 DIAGNOSIS — F1020 Alcohol dependence, uncomplicated: Secondary | ICD-10-CM | POA: Insufficient documentation

## 2013-12-25 DIAGNOSIS — F17210 Nicotine dependence, cigarettes, uncomplicated: Secondary | ICD-10-CM | POA: Insufficient documentation

## 2013-12-25 DIAGNOSIS — Z1211 Encounter for screening for malignant neoplasm of colon: Secondary | ICD-10-CM | POA: Diagnosis not present

## 2013-12-25 DIAGNOSIS — R188 Other ascites: Secondary | ICD-10-CM

## 2013-12-25 DIAGNOSIS — K7031 Alcoholic cirrhosis of liver with ascites: Secondary | ICD-10-CM

## 2013-12-25 DIAGNOSIS — K635 Polyp of colon: Secondary | ICD-10-CM | POA: Diagnosis present

## 2013-12-25 DIAGNOSIS — K429 Umbilical hernia without obstruction or gangrene: Secondary | ICD-10-CM

## 2013-12-25 DIAGNOSIS — I8500 Esophageal varices without bleeding: Secondary | ICD-10-CM | POA: Diagnosis present

## 2013-12-25 DIAGNOSIS — K208 Other esophagitis: Secondary | ICD-10-CM

## 2013-12-25 DIAGNOSIS — K649 Unspecified hemorrhoids: Secondary | ICD-10-CM | POA: Diagnosis present

## 2013-12-25 HISTORY — PX: ESOPHAGOGASTRODUODENOSCOPY: SHX5428

## 2013-12-25 HISTORY — PX: COLONOSCOPY: SHX5424

## 2013-12-25 HISTORY — DX: Unspecified hemorrhoids: K64.9

## 2013-12-25 HISTORY — DX: Esophageal varices without bleeding: I85.00

## 2013-12-25 SURGERY — COLONOSCOPY
Anesthesia: Moderate Sedation

## 2013-12-25 MED ORDER — FENTANYL CITRATE 0.05 MG/ML IJ SOLN
INTRAMUSCULAR | Status: AC
  Filled 2013-12-25: qty 4

## 2013-12-25 MED ORDER — FENTANYL CITRATE 0.05 MG/ML IJ SOLN
INTRAMUSCULAR | Status: DC | PRN
  Administered 2013-12-25 (×4): 25 ug via INTRAVENOUS

## 2013-12-25 MED ORDER — MIDAZOLAM HCL 10 MG/2ML IJ SOLN
INTRAMUSCULAR | Status: DC | PRN
  Administered 2013-12-25 (×2): 1 mg via INTRAVENOUS
  Administered 2013-12-25 (×4): 2 mg via INTRAVENOUS

## 2013-12-25 MED ORDER — BUTAMBEN-TETRACAINE-BENZOCAINE 2-2-14 % EX AERO
INHALATION_SPRAY | CUTANEOUS | Status: DC | PRN
  Administered 2013-12-25: 2 via TOPICAL

## 2013-12-25 MED ORDER — SODIUM CHLORIDE 0.9 % IV SOLN: INTRAVENOUS | Status: DC

## 2013-12-25 MED ORDER — DIPHENHYDRAMINE HCL 50 MG/ML IJ SOLN
INTRAMUSCULAR | Status: AC
  Filled 2013-12-25: qty 1

## 2013-12-25 MED ORDER — OMEPRAZOLE 20 MG PO CPDR: 20.0000 mg | DELAYED_RELEASE_CAPSULE | Freq: Every day | ORAL | Status: DC

## 2013-12-25 MED ORDER — MIDAZOLAM HCL 10 MG/2ML IJ SOLN
INTRAMUSCULAR | Status: AC
  Filled 2013-12-25: qty 2

## 2013-12-25 NOTE — Op Note (Signed)
[facility] [place], [id number]   ENDOSCOPY PROCEDURE REPORT  PATIENT: Shawn Nguyen, Shawn Nguyen  MR#: [id number] BIRTHDATE: Oct 19, 1955 , 64  yrs. old GENDER: male ENDOSCOPIST: Gatha Mayer, MD, [facility] PROCEDURE DATE:  12/25/2013 PROCEDURE:  EGD w/ band ligation of varices ASA CLASS:     Class III INDICATIONS:  screening for varices. MEDICATIONS: Fentanyl 75 mcg IV and Versed 8 mg IV TOPICAL ANESTHETIC: Cetacaine Spray  DESCRIPTION OF PROCEDURE: After the risks benefits and alternatives of the procedure were thoroughly explained, informed consent was obtained.  The    endoscope was introduced through the mouth and advanced to the second portion of the duodenum , Without limitations.  The instrument was slowly withdrawn as the mucosa was fully examined.    1) 3 columns small esophageal varices 2) erosions at GE junction and one was bleeding at end of a varyx so I placed a rubber band 3) Otherwise Normal EGD.  Retroflexed views revealed as previously described.     The scope was then withdrawn from the patient and the procedure completed.  COMPLICATIONS: There were no immediate complications.  ENDOSCOPIC IMPRESSION: 1) 3 columns small esophageal varices 2) erosions at GE junction and one was bleeding at end of a varyx so I placed a rubber band 3) Otherwise Normal EGD  RECOMMENDATIONS: Start omeprazole 20 mg daily will arrange f/u colonoscopy next   eSigned:  Gatha Mayer, MD, [facility] 12/25/2013 11:04 AM    CC:The Patient

## 2013-12-25 NOTE — Discharge Instructions (Addendum)
° ° °  I found some slight bleeding in the esophagus and placed a rubber band over it to stop the bleeding. It looks like you have acid reflux damage in the esophagus and swollen veins called varices. Please get omeprazole and start taking that every day to heal the esophagus.   I found 3 tiny colon polyps and removed them. These were not a problem but I removed them so they would not become cancers.  You also have large hemorrhoids and swollen veins (varices) in the rectum.  YOU HAD AN ENDOSCOPIC PROCEDURE TODAY: Refer to the procedure report and other information in the discharge instructions given to you for any specific questions about what was found during the examination. If this information does not answer your questions, please call Dr. Celesta Aver office at [phone] to clarify.   YOU SHOULD EXPECT: Some feelings of bloating in the abdomen. Passage of more gas than usual. Walking can help get rid of the air that was put into your GI tract during the procedure and reduce the bloating. If you had a lower endoscopy (such as a colonoscopy or flexible sigmoidoscopy) you may notice spotting of blood in your stool or on the toilet paper. Some abdominal soreness may be present for a day or two, also.  DIET: Your first meal following the procedure should be a light meal and then it is ok to progress to your normal diet. A half-sandwich or bowl of soup is an example of a good first meal. Heavy or fried foods are harder to digest and may make you feel nauseous or bloated. Drink plenty of fluids but you should avoid alcoholic beverages for 24 hours.   ACTIVITY: Your care partner should take you home directly after the procedure. You should plan to take it easy, moving slowly for the rest of the day. You can resume normal activity the day after the procedure however YOU SHOULD NOT DRIVE, use power tools, machinery or perform tasks that involve climbing or major physical exertion for 24 hours (because  of the sedation medicines used during the test).   SYMPTOMS TO REPORT IMMEDIATELY: A gastroenterologist can be reached at any hour. Please call [phone]  for any of the following symptoms:  Following lower endoscopy (colonoscopy, flexible sigmoidoscopy) Excessive amounts of blood in the stool  Significant tenderness, worsening of abdominal pains  Swelling of the abdomen that is new, acute  Fever of 100 or higher  Following upper endoscopy (EGD, EUS, ERCP, esophageal dilation) Vomiting of blood or coffee ground material  New, significant abdominal pain  New, significant chest pain or pain under the shoulder blades  Painful or persistently difficult swallowing  New shortness of breath  Black, tarry-looking or red, bloody stools  FOLLOW UP:  If any biopsies were taken you will be contacted by phone or by letter within the next 1-3 weeks. Call [phone]  if you have not heard about the biopsies in 3 weeks.  Please also call with any specific questions about appointments or follow up tests.   I appreciate the opportunity to care for you. Gatha Mayer, MD, [facility]

## 2013-12-25 NOTE — H&P (Signed)
  [facility] History and Physical   Primary Care Physician:  Angelica Chessman, MD   Reason for Procedure:   Screen for esophageal varices and screen for colon cancer  Plan:    EGD     Colonoscopy     The risks and benefits as well as alternatives of endoscopic procedure(s) have been discussed and reviewed. All questions answered. The patient agrees to proceed.      HPI: Shawn Nguyen is a 58 y.o. male with HCV/EtOH cirrhosis. He needs a screening EGD for varices and will have a screening colonoscopy also.   Past Medical History  Diagnosis Date  . Hepatitis C     tested + in 07/2013.   Marland Kitchen HTN (hypertension)   . Ascites 03/2013     6 liter paracentesis 03/2013, 9 liter tap 04/2013, 5 liter tap 08/08/13. No SBP on 6/11 tap.  Marland Kitchen Alcoholism   . Gallstones 03/2013    on ultrasound.   Marland Kitchen Umbilical hernia 05/345    draining ascites  . Cirrhosis 06/14/2013    History reviewed. No pertinent past surgical history.  Prior to Admission medications   Medication Sig Start Date End Date Taking? Authorizing Provider  spironolactone (ALDACTONE) 50 MG tablet Take 2 tablets (100 mg total) by mouth daily. 11/20/13  Yes Gatha Mayer, MD    Current Facility-Administered Medications  Medication Dose Route Frequency Provider Last Rate Last Dose  . 0.9 %  sodium chloride infusion   Intravenous Continuous Gatha Mayer, MD        Allergies as of 11/20/2013  . (No Known Allergies)    Family History  Problem Relation Age of Onset  . Diabetes Father     History   Social History  . Marital Status: Single    Spouse Name: N/A    Number of Children: 2  . Years of Education: N/A   Occupational History  . Not on file.   Social History Main Topics  . Smoking status: Current Some Day Smoker -- 0.25 packs/day    Types: Cigarettes  . Smokeless tobacco: Never Used     Comment: cutting way back  . Alcohol Use: Yes     Comment: occasional  . Drug Use: No     Comment: former  2005  . Sexual Activity: Not on file   Other Topics Concern  . 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    Review of Systems: All other review of systems negative except as mentioned in the HPI.  Physical Exam: Vital signs in last 24 hours: Temp:  [98.2 F (36.8 C)] 98.2 F (36.8 C) (10/28 0843) Pulse Rate:  [73] 73 (10/28 0843) Resp:  [14] 14 (10/28 0843) BP: (128)/(86) 128/86 mmHg (10/28 0843) SpO2:  [98 %] 98 % (10/28 0843) Weight:  [168 lb (76.204 kg)] 168 lb (76.204 kg) (10/28 0843)   General:   Alert,  Well-developed, well-nourished, pleasant and cooperative in NAD Lungs:  Clear throughout to auscultation.   Heart:  Regular rate and rhythm; no murmurs, clicks, rubs,  or gallops. Abdomen:  Soft, nontender and  moderately distended with ascites. + Umbilical hernia Normal bowel sounds.   Neuro/Psych:  Alert and cooperative. Normal mood and affect. A and O x 3   @Shallyn Constancio  Simonne Maffucci, MD, [facility] [phone] (pager) 12/25/2013 9:11 AM@

## 2013-12-25 NOTE — Op Note (Signed)
[facility] [place], [address]   COLONOSCOPY PROCEDURE REPORT  PATIENT: Shawn Nguyen, Shawn Nguyen  MR#: [id number] BIRTHDATE: Apr 27, 1955 , 61  yrs. old GENDER: male ENDOSCOPIST: Gatha Mayer, MD, [facility] PROCEDURE DATE:  12/25/2013 PROCEDURE:   Colonoscopy with biopsy First Screening Colonoscopy - Avg.  risk and is 50 yrs.  old or older Yes.  Prior Negative Screening - Now for repeat screening. N/A  History of Adenoma - Now for follow-up colonoscopy & has been > or = to 3 yrs.  N/A  Polyps Removed Today? Yes. ASA CLASS:   Class III INDICATIONS:average risk for colorectal cancer and first colonoscopy. MEDICATIONS: Residual sedation present, Fentanyl 25 mcg IV, and Versed 2 mg IV  DESCRIPTION OF PROCEDURE:   After the risks benefits and alternatives of the procedure were thoroughly explained, informed consent was obtained.  The digital rectal exam revealed no abnormalities of the rectum, revealed the prostate was not enlarged, and revealed no prostatic nodules.   The EC-[id number] ([id number])  endoscope was introduced through the anus and advanced to the cecum, which was identified by both the appendix and ileocecal valve. No adverse events experienced.   The quality of the prep was good.  The instrument was then slowly withdrawn as the colon was fully examined.    COLON FINDINGS: Two sessile polyps ranging from 2 to 25mm in size were found at the cecum.  A polypectomy was performed with cold forceps.  The resection was complete, the polyp tissue was completely retrieved and sent to histology.   A sessile polyp measuring 3 mm in size was found in the transverse colon.  A polypectomy was performed.  The resection was complete, the polyp tissue was completely retrieved and sent to histology.   The examination was otherwise normal.   Small rectal varices.  rectal varices. The time to cecum=2 minutes 0 seconds.  Withdrawal time=9 minutes 0 seconds.  The scope was  withdrawn and the procedure completed. COMPLICATIONS: There were no immediate complications.  ENDOSCOPIC IMPRESSION: 1.   Two sessile polyps ranging from 2 to 67mm in size were found at the cecum; polypectomy was performed with cold forceps 2.   Sessile polyp measuring 3 mm in size was found in the transverse colon; polypectomy was performed 3.   The colon examination was otherwise normal 4.   Small rectal varices  RECOMMENDATIONS: 1.  Await pathology results 2.  May not need routine repeat colonoscopy  eSigned:  Gatha Mayer, MD, [facility] 12/25/2013 11:12 AM   cc: The Patient

## 2013-12-26 ENCOUNTER — Encounter (HOSPITAL_COMMUNITY): Payer: Self-pay | Admitting: Internal Medicine

## 2013-12-26 NOTE — Progress Notes (Signed)
Quick Note:  Let him know polyps benign - no cancer No colon recall needed  He needs to do a BMET - already ordered Next week is ok  ______

## 2014-01-01 ENCOUNTER — Other Ambulatory Visit (INDEPENDENT_AMBULATORY_CARE_PROVIDER_SITE_OTHER): Payer: Medicaid Other

## 2014-01-01 DIAGNOSIS — E871 Hypo-osmolality and hyponatremia: Secondary | ICD-10-CM

## 2014-01-01 LAB — BASIC METABOLIC PANEL
BUN: 8 mg/dL (ref 6–23)
CO2: 27 mEq/L (ref 19–32)
Calcium: 8.9 mg/dL (ref 8.4–10.5)
Chloride: 100 mEq/L (ref 96–112)
Creatinine, Ser: 0.9 mg/dL (ref 0.4–1.5)
GFR: 119.22 mL/min (ref >60.00)
Glucose, Bld: 67 mg/dL — ABNORMAL LOW (ref 70–99)
Potassium: 4.4 mEq/L (ref 3.5–5.1)
SODIUM: 134 meq/L — AB (ref 135–145)

## 2014-01-03 ENCOUNTER — Other Ambulatory Visit: Payer: Self-pay

## 2014-01-03 DIAGNOSIS — R188 Other ascites: Secondary | ICD-10-CM

## 2014-01-03 MED ORDER — FUROSEMIDE 20 MG PO TABS: 20.0000 mg | ORAL_TABLET | Freq: Every day | ORAL | Status: DC

## 2014-01-03 NOTE — Progress Notes (Signed)
Quick Note:  These labs ok \\start  furosemide 20 mg every AM #30 3 RF Bmet 1 month See me in Feb ______

## 2014-01-03 NOTE — Addendum Note (Signed)
Addended by: Marlon Pel on: 01/03/2014 11:02 AM   Modules accepted: Orders

## 2014-01-07 ENCOUNTER — Telehealth: Payer: Self-pay | Admitting: Gastroenterology

## 2014-01-07 MED ORDER — FUROSEMIDE 20 MG PO TABS: 20.0000 mg | ORAL_TABLET | Freq: Every day | ORAL | Status: DC

## 2014-01-07 NOTE — Telephone Encounter (Signed)
[facility]. I have sent this rx. Patient advised.

## 2014-01-21 ENCOUNTER — Ambulatory Visit: Payer: Medicaid Other

## 2014-02-05 ENCOUNTER — Ambulatory Visit: Payer: Medicaid Other

## 2014-02-11 ENCOUNTER — Ambulatory Visit: Payer: Medicaid Other

## 2014-02-13 ENCOUNTER — Ambulatory Visit: Payer: Medicaid Other

## 2014-02-26 ENCOUNTER — Other Ambulatory Visit (INDEPENDENT_AMBULATORY_CARE_PROVIDER_SITE_OTHER): Payer: Medicaid Other

## 2014-02-26 DIAGNOSIS — R188 Other ascites: Secondary | ICD-10-CM

## 2014-02-26 LAB — BASIC METABOLIC PANEL
BUN: 8 mg/dL (ref 6–23)
CHLORIDE: 100 meq/L (ref 96–112)
CO2: 29 mEq/L (ref 19–32)
Calcium: 9.1 mg/dL (ref 8.4–10.5)
Creatinine, Ser: 0.8 mg/dL (ref 0.4–1.5)
GFR: 131.58 mL/min (ref >60.00)
Glucose, Bld: 90 mg/dL (ref 70–99)
POTASSIUM: 4.6 meq/L (ref 3.5–5.1)
SODIUM: 136 meq/L (ref 135–145)

## 2014-02-26 NOTE — Progress Notes (Signed)
Quick Note:  Let him know labs are ok If he does not have a Feb appt please set one up ______

## 2014-03-04 ENCOUNTER — Ambulatory Visit: Payer: Medicaid Other

## 2014-03-19 ENCOUNTER — Ambulatory Visit (INDEPENDENT_AMBULATORY_CARE_PROVIDER_SITE_OTHER): Payer: Medicaid Other | Admitting: Internal Medicine

## 2014-03-19 ENCOUNTER — Encounter: Payer: Self-pay | Admitting: Internal Medicine

## 2014-03-19 VITALS — BP 151/100 | HR 80 | Temp 98.2°F | Wt 175.0 lb

## 2014-03-19 DIAGNOSIS — K7460 Unspecified cirrhosis of liver: Principal | ICD-10-CM

## 2014-03-19 DIAGNOSIS — B182 Chronic viral hepatitis C: Secondary | ICD-10-CM

## 2014-03-19 NOTE — Progress Notes (Signed)
   Subjective:    Patient ID: Shawn Nguyen, male    DOB: 11/12/1955, 59 y.o.   MRN: [id number]  HPI He is here for follow-up of hepatitis C. He has a long history of alcohol abuse and has cirrhosis with child Pugh class B. He was seen by gastroenterology and did have screen for varices. He is on medication for ascites. He has had paracenteses 3 in the past. He currently though is relatively asymptomatic with no belly swelling and is much improved on medication management. He also has been going to see our substance abuse counselor and has recently started to abstain from alcohol. He feels much better and is pleased with his progress.   Review of Systems  Constitutional: Negative for appetite change, fatigue and unexpected weight change.  Gastrointestinal: Negative for diarrhea and abdominal distention.  Neurological: Negative for dizziness and light-headedness.       Objective:   Physical Exam  Constitutional: He appears well-developed and well-nourished.  Eyes: No scleral icterus.  Cardiovascular: Normal rate, regular rhythm and normal heart sounds.   No murmur heard. Pulmonary/Chest: Effort normal and breath sounds normal. No respiratory distress.  Skin: No rash noted.          Assessment & Plan:

## 2014-03-19 NOTE — Assessment & Plan Note (Signed)
I'm going to have him return in 6 months and he will need to continue to abstain from alcohol. At that time, I think he will be ready to sign the readiness form for Medicaid. I will then consider getting him medication for hepatitis C then. He seems to be much improved with symptomatic treatment.

## 2014-03-20 ENCOUNTER — Ambulatory Visit: Payer: Medicaid Other

## 2014-04-01 ENCOUNTER — Ambulatory Visit: Payer: Medicaid Other | Admitting: *Deleted

## 2014-04-01 DIAGNOSIS — F1010 Alcohol abuse, uncomplicated: Secondary | ICD-10-CM

## 2014-04-01 NOTE — BH Specialist Note (Signed)
Shawn Nguyen was present for his scheduled appointment today.  Patient was oriented times four with good affect and dress. Patient was in good spirits as evidenced by talking freely and smiling.  Patient asked how long he needed to meet with counselor per Dr. Linus Salmons suggestion.  Counselor communicated to patient that when substance abuse is involved, [facility] treatment can be compromised.  Counselor encouraged patient to continue to stop his drinking and smoking in order to obtain the necessary treatment and medications accordingly. Patient communicated that he had cut down drastically from drinking daily to just drinking a beer or two on the weekends.  Patient indicated that he only smokes three times a day now which is typically after he eats.  Patient stated that Dr. Linus Salmons wanted him to stop drinking and smoking for one year.  Patient stated that he could not do that nor wanted too but felt he had really made a difference with his use presently cutting back substantially. Patient discussed his attitude towards life and how much healthier he feels since getting off drugs in general.  Patient shared that he is enjoying experiencing simple things that were a blur while on drugs and alcohol. Patient stated that he would like to possible meet with [facility] to discuss housing issues.  Counselor gave patient the necessary brochure with the address and phone number and encouraged patient to call and set up the appointment with them.  Counselor provided support and encouragement to patient accordingly.   Rolena Infante, LPCA, MA Alcohol and Drug Services

## 2014-04-22 ENCOUNTER — Encounter: Payer: Self-pay | Admitting: Internal Medicine

## 2014-04-22 ENCOUNTER — Telehealth: Payer: Self-pay | Admitting: Internal Medicine

## 2014-04-22 ENCOUNTER — Ambulatory Visit (INDEPENDENT_AMBULATORY_CARE_PROVIDER_SITE_OTHER): Payer: Medicaid Other | Admitting: Internal Medicine

## 2014-04-22 VITALS — BP 148/90 | HR 70 | Resp 14 | Ht 73.5 in | Wt 171.2 lb

## 2014-04-22 DIAGNOSIS — B182 Chronic viral hepatitis C: Secondary | ICD-10-CM

## 2014-04-22 DIAGNOSIS — K7460 Unspecified cirrhosis of liver: Secondary | ICD-10-CM

## 2014-04-22 DIAGNOSIS — R030 Elevated blood-pressure reading, without diagnosis of hypertension: Secondary | ICD-10-CM

## 2014-04-22 DIAGNOSIS — IMO0001 Reserved for inherently not codable concepts without codable children: Secondary | ICD-10-CM

## 2014-04-22 DIAGNOSIS — R188 Other ascites: Secondary | ICD-10-CM

## 2014-04-22 NOTE — Assessment & Plan Note (Addendum)
Stable-improved Recheck [place]

## 2014-04-22 NOTE — Assessment & Plan Note (Signed)
Waiting on Tx - abstinence requirement

## 2014-04-22 NOTE — Telephone Encounter (Signed)
Patient has come n today to speak with a nurse about his elevated BP; Patient had a GI appointment this morning and the specialist instructed him to stop by to inform PCP about his BP readings; please f/u with patient if he needs to come in for a consultation ;

## 2014-04-22 NOTE — Patient Instructions (Addendum)
  You have been scheduled for an abdominal ultrasound at [facility] Radiology (1st floor of hospital) on 05/20/14 at 8:30Am. Please arrive 15 minutes prior to your appointment for registration. Make certain not to have anything to eat or drink 6 hours prior to your appointment. Should you need to reschedule your appointment, please contact radiology at [phone]. This test typically takes about 30 minutes to perform.   Please follow up with your primary care Doctor about your elevated blood pressure reading today.   Follow up with Dr. Carlean Purl in 6 months.   I appreciate the opportunity to care for you

## 2014-04-23 ENCOUNTER — Encounter: Payer: Self-pay | Admitting: Internal Medicine

## 2014-04-23 NOTE — Progress Notes (Signed)
   Subjective:    Patient ID: Shawn Nguyen, male    DOB: 12-23-1955, 59 y.o.   MRN: [id number] Chief Complaint is follow-up of cirrhosis and ascites HPI  The patient is here for follow-up. He is continuing to be abstinent and after some time in that situation he will be eligible to be treated for hepatitis C. His fluid status seems stable he is not having the ascites problems he was. He is concerned that his blood pressure is somewhat elevated today. Medications, allergies, past medical history, past surgical history, family history and social history are reviewed and updated in the EMR.  Review of Systems As above    Objective:   Physical Exam BP 148/90 mmHg  Pulse 70  Resp 14  Ht 6' 1.5" (1.867 m)  Wt 171 lb 3.2 oz (77.656 kg)  BMI 22.28 kg/m2 Eyes are anicteric Lungs clear Heart S1-S2 no rubs or murmurs or gallops Abdomen has a moderate amount of ascites and is nontender. Umbilical hernia is not very prominent. Extremities show no edema. He is alert and oriented 3     Assessment & Plan:  Cirrhosis Stable-improved Recheck [place]    Chronic hepatitis C with cirrhosis Waiting on Tx - abstinence requirement   Ascites Improved, continue current therapy    he will return in 6 months sooner if needed He will follow-up primary care regarding elevated blood pressure

## 2014-04-23 NOTE — Assessment & Plan Note (Addendum)
Improved, continue current therapy

## 2014-04-24 ENCOUNTER — Telehealth: Payer: Self-pay | Admitting: Internal Medicine

## 2014-04-24 NOTE — Telephone Encounter (Signed)
Patient called to speak to nurse about is BP readings, patient states that they have been elevated and would like to go over the readings with his PCP's nurse.

## 2014-04-25 NOTE — Telephone Encounter (Signed)
Pt was advised to schedule f/u appointment

## 2014-04-29 ENCOUNTER — Ambulatory Visit: Payer: Medicaid Other | Admitting: *Deleted

## 2014-04-29 DIAGNOSIS — F1020 Alcohol dependence, uncomplicated: Secondary | ICD-10-CM

## 2014-04-29 NOTE — BH Specialist Note (Signed)
Shawn Nguyen was present today for his scheduled appointment with counselor.  Patient was oriented times four with good affect and dress. However, patient tended to discuss things irrelevant to his health and or substance use.  Patient was alert and talkative with counselor but rarely gave counselor eye contact.  Patient sat in a chair across from counselor at a 90 degree angle to counselor so no eye contact could be made.   Patient shared random information about his philosophy of life and morals.  Patient did share that he was working with [place] on his housing issue.  Patient indicated that staying in the motel he has been living in is not working out.  Patient says that it takes too much of his budget every month. Patient denied any substance use with the exception of an occasional beer. Patient communicated that he felt he was doing well and was emotionally stable.  Patient stated that he left the drugs eleven years ago and has not touched them since.  Patient did not feel like he needed to see counselor any further as he says he is in a good place and functioning accordingly by himself. Counselor congratulated patient on doing so well and becoming independent without the use of drugs and excessive alcohol. Counselor encouraged patient to make an appointment with counselor if he found himself in a place of not being able to cope well or feeling like he might relapse.  Patient agreed.  Rolena Infante, LPCA, MA Alcohol and Drug Services

## 2014-05-19 ENCOUNTER — Ambulatory Visit: Payer: Medicaid Other | Attending: Internal Medicine | Admitting: *Deleted

## 2014-05-19 VITALS — BP 133/75 | HR 86 | Temp 98.7°F | Resp 16 | Wt 159.6 lb

## 2014-05-19 DIAGNOSIS — I10 Essential (primary) hypertension: Secondary | ICD-10-CM | POA: Insufficient documentation

## 2014-05-19 DIAGNOSIS — IMO0001 Reserved for inherently not codable concepts without codable children: Secondary | ICD-10-CM

## 2014-05-19 DIAGNOSIS — R030 Elevated blood-pressure reading, without diagnosis of hypertension: Secondary | ICD-10-CM

## 2014-05-19 NOTE — Progress Notes (Signed)
Patient presents for BP check per his gastroententerologist Med list reviewed; states taking all meds as directed Discussed need for low sodium diet and using Mrs. Dash as alternative to salt Encouraged to choose foods with 5% or less of daily value for sodium. Patient walking 1 mile per day for exercise Patient denies headaches, blurred vision, SHOB, chest pain or pressure  BP 133/75 P 86 R 16  T  98.7 oral SPO2 100%  Wt 159.6 lb  Patient advised to schedule appt with PCP as he has not been seen in our office since July 2015  Patient given literature on [facility] Eating Plan

## 2014-05-19 NOTE — Patient Instructions (Signed)
DASH Eating Plan °DASH stands for "Dietary Approaches to Stop Hypertension." The DASH eating plan is a healthy eating plan that has been shown to reduce high blood pressure (hypertension). Additional health benefits may include reducing the risk of type 2 diabetes mellitus, heart disease, and stroke. The DASH eating plan may also help with weight loss. °WHAT DO I NEED TO KNOW ABOUT THE DASH EATING PLAN? °For the DASH eating plan, you will follow these general guidelines: °· Choose foods with a percent daily value for sodium of less than 5% (as listed on the food label). °· Use salt-free seasonings or herbs instead of table salt or sea salt. °· Check with your health care provider or pharmacist before using salt substitutes. °· Eat lower-sodium products, often labeled as "lower sodium" or "no salt added." °· Eat fresh foods. °· Eat more vegetables, fruits, and low-fat dairy products. °· Choose whole grains. Look for the word "whole" as the first word in the ingredient list. °· Choose fish and skinless chicken or turkey more often than red meat. Limit fish, poultry, and meat to 6 oz (170 g) each day. °· Limit sweets, desserts, sugars, and sugary drinks. °· Choose heart-healthy fats. °· Limit cheese to 1 oz (28 g) per day. °· Eat more home-cooked food and less restaurant, buffet, and fast food. °· Limit fried foods. °· Cook foods using methods other than frying. °· Limit canned vegetables. If you do use them, rinse them well to decrease the sodium. °· When eating at a restaurant, ask that your food be prepared with less salt, or no salt if possible. °WHAT FOODS CAN I EAT? °Seek help from a dietitian for individual calorie needs. °Grains °Whole grain or whole wheat bread. Brown rice. Whole grain or whole wheat pasta. Quinoa, bulgur, and whole grain cereals. Low-sodium cereals. Corn or whole wheat flour tortillas. Whole grain cornbread. Whole grain crackers. Low-sodium crackers. °Vegetables °Fresh or frozen vegetables  (raw, steamed, roasted, or grilled). Low-sodium or reduced-sodium tomato and vegetable juices. Low-sodium or reduced-sodium tomato sauce and paste. Low-sodium or reduced-sodium canned vegetables.  °Fruits °All fresh, canned (in natural juice), or frozen fruits. °Meat and Other Protein Products °Ground beef (85% or leaner), grass-fed beef, or beef trimmed of fat. Skinless chicken or turkey. Ground chicken or turkey. Pork trimmed of fat. All fish and seafood. Eggs. Dried beans, peas, or lentils. Unsalted nuts and seeds. Unsalted canned beans. °Dairy °Low-fat dairy products, such as skim or 1% milk, 2% or reduced-fat cheeses, low-fat ricotta or cottage cheese, or plain low-fat yogurt. Low-sodium or reduced-sodium cheeses. °Fats and Oils °Tub margarines without trans fats. Light or reduced-fat mayonnaise and salad dressings (reduced sodium). Avocado. Safflower, olive, or canola oils. Natural peanut or almond butter. °Other °Unsalted popcorn and pretzels. °The items listed above may not be a complete list of recommended foods or beverages. Contact your dietitian for more options. °WHAT FOODS ARE NOT RECOMMENDED? °Grains °White bread. White pasta. White rice. Refined cornbread. Bagels and croissants. Crackers that contain trans fat. °Vegetables °Creamed or fried vegetables. Vegetables in a cheese sauce. Regular canned vegetables. Regular canned tomato sauce and paste. Regular tomato and vegetable juices. °Fruits °Dried fruits. Canned fruit in light or heavy syrup. Fruit juice. °Meat and Other Protein Products °Fatty cuts of meat. Ribs, chicken wings, bacon, sausage, bologna, salami, chitterlings, fatback, hot dogs, bratwurst, and packaged luncheon meats. Salted nuts and seeds. Canned beans with salt. °Dairy °Whole or 2% milk, cream, half-and-half, and cream cheese. Whole-fat or sweetened yogurt. Full-fat   cheeses or blue cheese. Nondairy creamers and whipped toppings. Processed cheese, cheese spreads, or cheese  curds. °Condiments °Onion and garlic salt, seasoned salt, table salt, and sea salt. Canned and packaged gravies. Worcestershire sauce. Tartar sauce. Barbecue sauce. Teriyaki sauce. Soy sauce, including reduced sodium. Steak sauce. Fish sauce. Oyster sauce. Cocktail sauce. Horseradish. Ketchup and mustard. Meat flavorings and tenderizers. Bouillon cubes. Hot sauce. Tabasco sauce. Marinades. Taco seasonings. Relishes. °Fats and Oils °Butter, stick margarine, lard, shortening, ghee, and bacon fat. Coconut, palm kernel, or palm oils. Regular salad dressings. °Other °Pickles and olives. Salted popcorn and pretzels. °The items listed above may not be a complete list of foods and beverages to avoid. Contact your dietitian for more information. °WHERE CAN I FIND MORE INFORMATION? °National Heart, Lung, and Blood Institute: www.nhlbi.nih.gov/health/health-topics/topics/dash/ °Document Released: 02/03/2011 Document Revised: 07/01/2013 Document Reviewed: 12/19/2012 °ExitCare® Patient Information ©2015 [employer]. This information is not intended to replace advice given to you by your health care provider. Make sure you discuss any questions you have with your health care provider. ° °

## 2014-05-20 ENCOUNTER — Ambulatory Visit (HOSPITAL_COMMUNITY): Payer: Medicaid Other

## 2014-05-27 ENCOUNTER — Ambulatory Visit: Payer: Medicaid Other | Admitting: Internal Medicine

## 2014-05-29 ENCOUNTER — Ambulatory Visit: Payer: Medicaid Other

## 2014-06-02 ENCOUNTER — Ambulatory Visit (HOSPITAL_COMMUNITY)
Admission: RE | Admit: 2014-06-02 | Discharge: 2014-06-02 | Disposition: A | Discharge status: Home / Self Care | Payer: Medicaid Other | Source: Ambulatory Visit | Attending: Internal Medicine | Admitting: Internal Medicine

## 2014-06-02 DIAGNOSIS — K8020 Calculus of gallbladder without cholecystitis without obstruction: Secondary | ICD-10-CM | POA: Insufficient documentation

## 2014-06-02 DIAGNOSIS — R188 Other ascites: Secondary | ICD-10-CM | POA: Insufficient documentation

## 2014-06-02 DIAGNOSIS — K7460 Unspecified cirrhosis of liver: Secondary | ICD-10-CM | POA: Diagnosis present

## 2014-06-02 DIAGNOSIS — B1920 Unspecified viral hepatitis C without hepatic coma: Secondary | ICD-10-CM | POA: Diagnosis not present

## 2014-06-03 ENCOUNTER — Ambulatory Visit: Payer: Medicaid Other | Admitting: *Deleted

## 2014-06-03 DIAGNOSIS — F1010 Alcohol abuse, uncomplicated: Secondary | ICD-10-CM

## 2014-06-06 NOTE — Progress Notes (Signed)
Quick Note:  No tumors in liver (what we were checking for) ______

## 2014-06-24 ENCOUNTER — Ambulatory Visit: Payer: Medicaid Other | Admitting: *Deleted

## 2014-07-01 ENCOUNTER — Ambulatory Visit: Payer: Medicaid Other | Admitting: *Deleted

## 2014-07-08 ENCOUNTER — Ambulatory Visit: Payer: Medicaid Other | Admitting: *Deleted

## 2014-07-22 ENCOUNTER — Ambulatory Visit: Payer: Medicaid Other | Admitting: *Deleted

## 2014-09-02 ENCOUNTER — Other Ambulatory Visit: Payer: Medicaid Other

## 2014-09-02 DIAGNOSIS — B1920 Unspecified viral hepatitis C without hepatic coma: Secondary | ICD-10-CM

## 2014-09-02 LAB — CBC WITH DIFFERENTIAL/PLATELET
Basophils Absolute: 0 10*3/uL (ref 0.0–0.1)
Basophils Relative: 1 % (ref 0–1)
EOS PCT: 1 % (ref 0–5)
Eosinophils Absolute: 0 10*3/uL (ref 0.0–0.7)
HCT: 42 % (ref 39.0–52.0)
HEMOGLOBIN: 14.2 g/dL (ref 13.0–17.0)
LYMPHS ABS: 1.9 10*3/uL (ref 0.7–4.0)
LYMPHS PCT: 43 % (ref 12–46)
MCH: 29.4 pg (ref 26.0–34.0)
MCHC: 33.8 g/dL (ref 30.0–36.0)
MCV: 87 fL (ref 78.0–100.0)
MONOS PCT: 15 % — AB (ref 3–12)
MPV: 11.7 fL (ref 8.6–12.4)
Monocytes Absolute: 0.7 10*3/uL (ref 0.1–1.0)
Neutro Abs: 1.8 10*3/uL (ref 1.7–7.7)
Neutrophils Relative %: 40 % — ABNORMAL LOW (ref 43–77)
Platelets: 133 10*3/uL — ABNORMAL LOW (ref 150–400)
RBC: 4.83 MIL/uL (ref 4.22–5.81)
RDW: 14.9 % (ref 11.5–15.5)
WBC: 4.5 10*3/uL (ref 4.0–10.5)

## 2014-09-02 LAB — COMPREHENSIVE METABOLIC PANEL
ALBUMIN: 3.7 g/dL (ref 3.5–5.2)
ALK PHOS: 170 U/L — AB (ref 39–117)
ALT: 35 U/L (ref 0–53)
AST: 77 U/L — ABNORMAL HIGH (ref 0–37)
BILIRUBIN TOTAL: 0.7 mg/dL (ref 0.2–1.2)
BUN: 7 mg/dL (ref 6–23)
CO2: 25 mEq/L (ref 19–32)
Calcium: 8.9 mg/dL (ref 8.4–10.5)
Chloride: 99 mEq/L (ref 96–112)
Creat: 0.71 mg/dL (ref 0.50–1.35)
Glucose, Bld: 95 mg/dL (ref 70–99)
Potassium: 4.2 mEq/L (ref 3.5–5.3)
SODIUM: 135 meq/L (ref 135–145)
Total Protein: 8.2 g/dL (ref 6.0–8.3)

## 2014-09-03 LAB — HEPATITIS C RNA QUANTITATIVE
HCV Quantitative Log: 6 {Log} — ABNORMAL HIGH (ref <1.18)
HCV Quantitative: 1004705 IU/mL — ABNORMAL HIGH (ref <15)

## 2014-09-09 LAB — HEPATITIS C GENOTYPE

## 2014-09-10 ENCOUNTER — Ambulatory Visit: Payer: Medicaid Other | Admitting: *Deleted

## 2014-09-18 ENCOUNTER — Ambulatory Visit: Payer: Medicaid Other | Admitting: Internal Medicine

## 2014-09-29 ENCOUNTER — Ambulatory Visit: Payer: Medicaid Other | Admitting: Internal Medicine

## 2014-10-15 IMAGING — US US PARACENTESIS
1 series · 8 of 8 positions shown · non-contrast
Comparison: Previous paracentesis

CLINICAL DATA: Hepatitis-C, recurrent ascites. Request therapeutic
paracentesis

EXAM:
ULTRASOUND GUIDED PARACENTESIS

[Series 1: us paracentesis · 0.27mm/px · 8 of 8 slices shown]
[im 1/8]
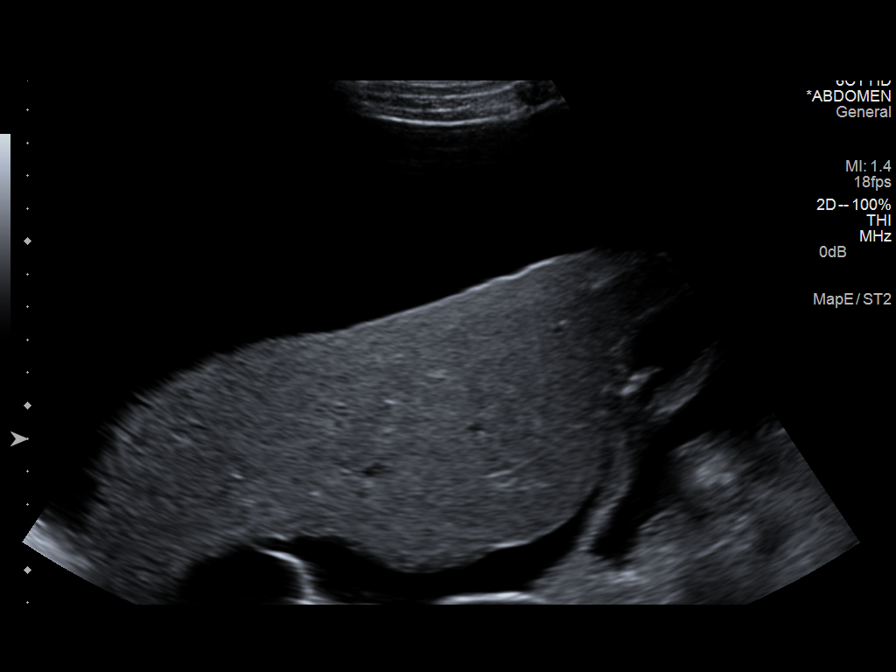
[im 2/8]
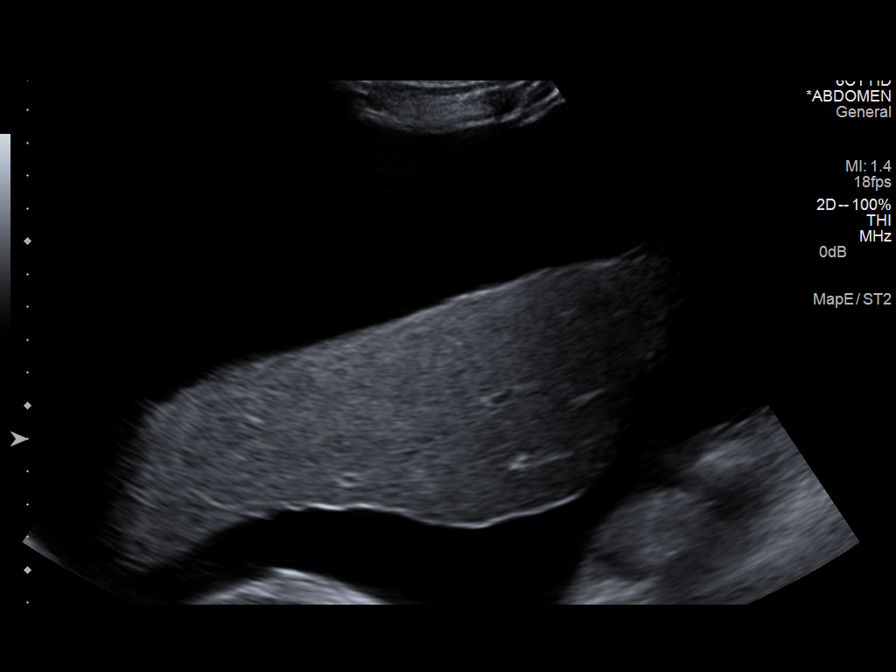
[im 3/8]
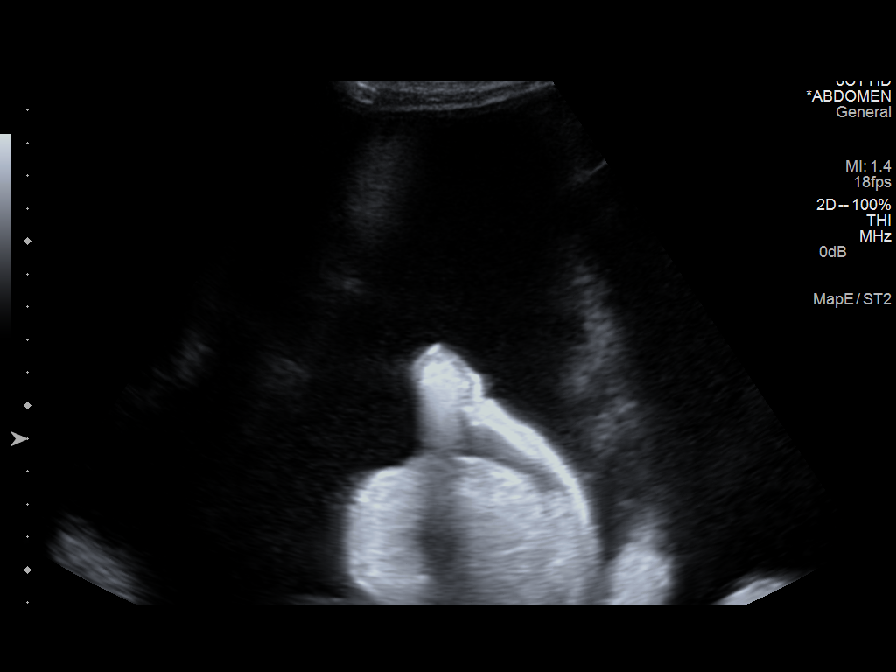
[im 4/8]
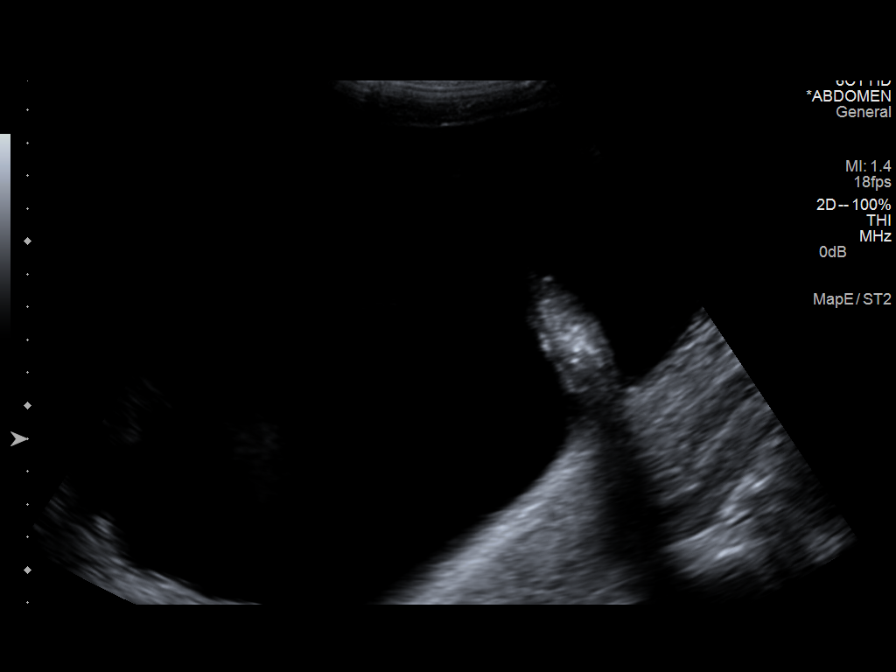
[im 5/8]
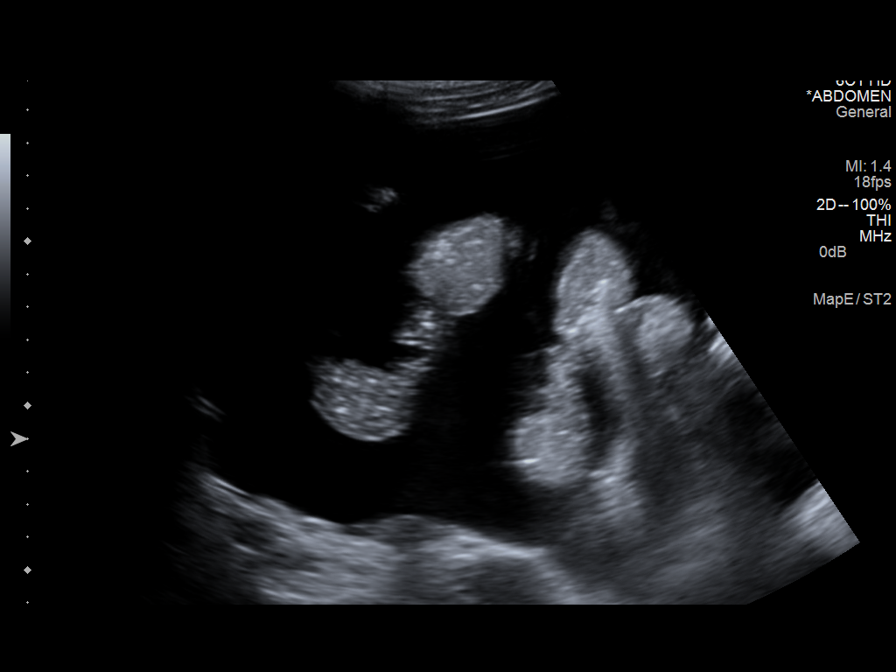
[im 6/8]
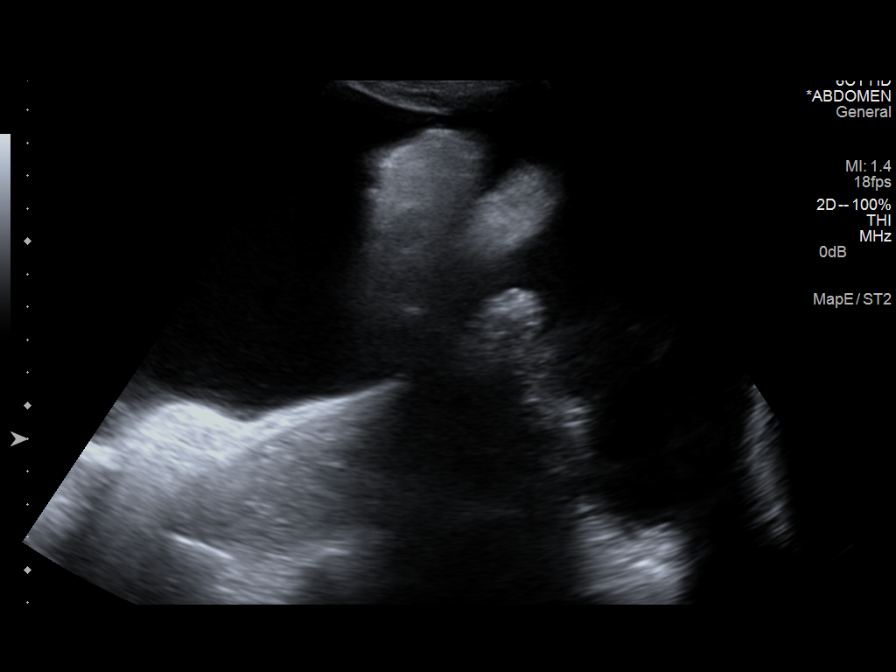
[im 7/8]
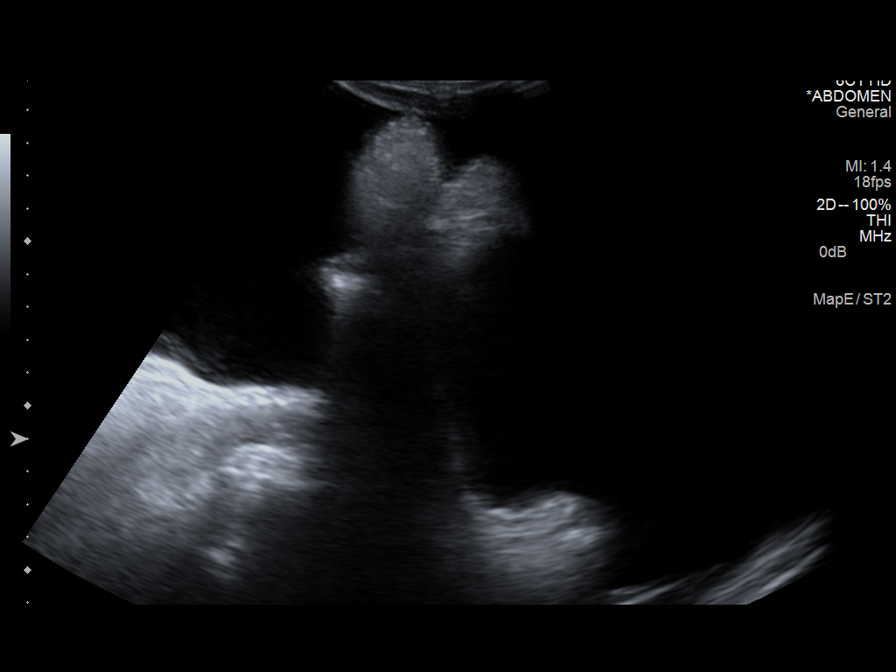
[im 8/8]
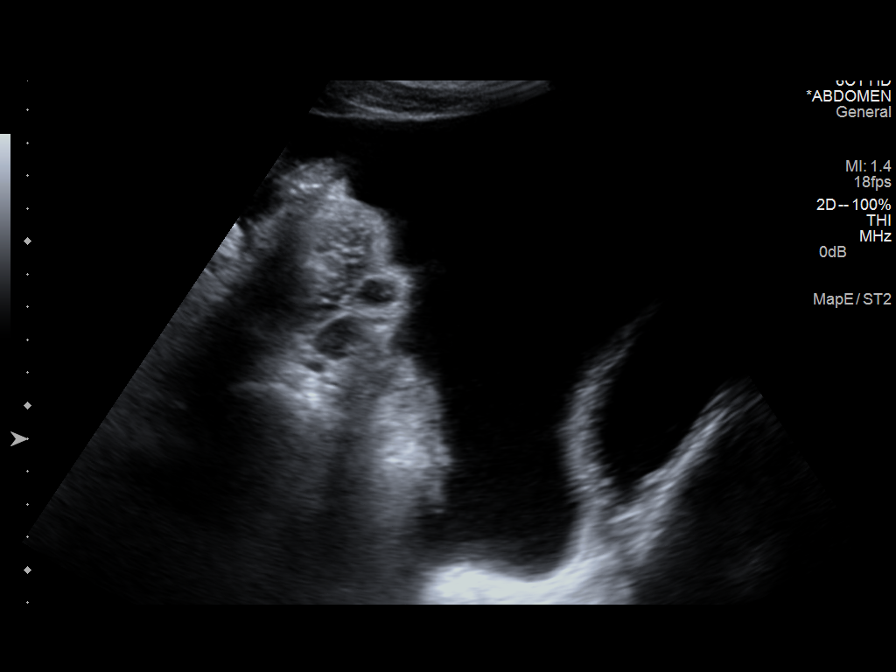

[8 of 8 positions shown; findings below may reference images not displayed]

PROCEDURE:
An ultrasound guided paracentesis was thoroughly discussed with the
patient and questions answered. The benefits, risks, alternatives
and complications were also discussed. The patient understands and
wishes to proceed with the procedure. Written consent was obtained.

Ultrasound was performed to localize and mark an adequate pocket of
fluid in the right lower quadrant of the abdomen. The area was then
prepped and draped in the normal sterile fashion. 1% Lidocaine was
used for local anesthesia. Under ultrasound guidance a 19 gauge Yueh
catheter was introduced. Paracentesis was performed. The catheter
was removed and a dressing applied.

COMPLICATIONS:
None immediate
FINDINGS: A total of approximately 9 L of clear yellow fluid was removed. A
fluid sample was not sent for laboratory analysis.
IMPRESSION: Successful ultrasound guided paracentesis yielding 9 L of ascites.

## 2014-11-06 ENCOUNTER — Encounter: Payer: Self-pay | Admitting: Internal Medicine

## 2014-11-06 ENCOUNTER — Other Ambulatory Visit: Payer: Self-pay | Admitting: Internal Medicine

## 2014-11-06 ENCOUNTER — Ambulatory Visit (INDEPENDENT_AMBULATORY_CARE_PROVIDER_SITE_OTHER): Payer: Medicaid Other | Admitting: Internal Medicine

## 2014-11-06 VITALS — BP 130/81 | HR 83 | Temp 98.1°F | Wt 180.0 lb

## 2014-11-06 DIAGNOSIS — B182 Chronic viral hepatitis C: Secondary | ICD-10-CM | POA: Diagnosis present

## 2014-11-06 DIAGNOSIS — K7460 Unspecified cirrhosis of liver: Principal | ICD-10-CM

## 2014-11-06 MED ORDER — LEDIPASVIR-SOFOSBUVIR 90-400 MG PO TABS: 1.0000 | ORAL_TABLET | Freq: Every day | ORAL | Status: DC

## 2014-11-06 MED ORDER — RIBAVIRIN 200 MG PO TABS: 600.0000 mg | ORAL_TABLET | Freq: Every day | ORAL | Status: DC

## 2014-11-06 NOTE — Progress Notes (Signed)
   Subjective:    Patient ID: Shawn Nguyen, male    DOB: 23-Nov-1955, 59 y.o.   MRN: [id number]  HPI He is here for follow-up of hepatitis C. He has a long history of alcohol abuse and has cirrhosis with child Pugh class B. He was seen by gastroenterology and did have screen for varices. He is on medication for ascites. He has had paracenteses 3 in the past. He currently though is relatively asymptomatic with no belly swelling and is much improved on medication management. He also has been going to see our substance abuse counselor and continues to abstain from alcohol. He feels much better and is pleased with his progress. Last [place] screen in April 2016.  On omeprazole 20 mg.     Review of Systems  Constitutional: Negative for appetite change, fatigue and unexpected weight change.  Gastrointestinal: Negative for diarrhea and abdominal distention.  Neurological: Negative for dizziness and light-headedness.       Objective:   Physical Exam  Constitutional: He appears well-developed and well-nourished.  Eyes: No scleral icterus.  Cardiovascular: Normal rate, regular rhythm and normal heart sounds.   No murmur heard. Pulmonary/Chest: Effort normal and breath sounds normal. No respiratory distress.  Skin: No rash noted.          Assessment & Plan:

## 2014-11-06 NOTE — Assessment & Plan Note (Signed)
i will prescribe harvoni for 12 weeks with 600 mg ribavirin.  Avoid others due to CP B.

## 2014-11-06 NOTE — Telephone Encounter (Signed)
Unable to reach patient , left voice mail to call and make appointment.  Also sent message  regarding appointment needed with the refill request.

## 2014-11-06 NOTE — Telephone Encounter (Signed)
Refill x 3 Schedule f/u next available

## 2014-11-06 NOTE — Telephone Encounter (Signed)
May I refill Sir? 

## 2014-11-11 ENCOUNTER — Telehealth: Payer: Self-pay | Admitting: Pharmacist Clinician (PhC)/ Clinical Pharmacy Specialist

## 2014-11-11 NOTE — Telephone Encounter (Signed)
Called Dervin to explain the harvoni and ribavirin to him. He has decompensate liver disease. He is also on prillosec. Told him to take the hep c meds, prilosec, and his other meds all at the same time under fasting condition. He understood.

## 2014-11-14 ENCOUNTER — Encounter (HOSPITAL_COMMUNITY): Payer: Self-pay | Admitting: Pharmacy Technician

## 2014-11-20 ENCOUNTER — Ambulatory Visit: Payer: Medicaid Other | Admitting: *Deleted

## 2014-12-02 ENCOUNTER — Ambulatory Visit: Payer: Medicaid Other | Admitting: *Deleted

## 2014-12-02 DIAGNOSIS — F1010 Alcohol abuse, uncomplicated: Secondary | ICD-10-CM

## 2014-12-02 NOTE — BH Specialist Note (Signed)
Shed was present today for his scheduled appointment.  Client was oriented times four with good affect and dress.  Client was alert and talkative.  Client came into counselor office, pulled chair out and sat down facing the wall which was a complete 90 degree angle to counselor.  Client sat the entire session that way without ever looking at counselor or providing eye contact. Client was resistant about his sobriety and counselor attempting to discuss it.  Counselor mentioned to client that he has been a no show the last 3-4 appointments to which client did not respond.  Client mostly talked about different things he was participating in right now such as a movie club, visiting with his grandchildren and spending time with his roommate.  Client stated that he had cut down on his drinking considerably but would not ever indicate how much he was drinking on a daily basis.  Client avoided the answer to that question throughout the session together. Counselor complimented client for showing back up for appointments and encouraged him to continue to do so.  Rolena Infante, LPCA, MA Alcohol and Drug Services/[facility]

## 2014-12-02 NOTE — BH Specialist Note (Deleted)
Shawn Nguyen was present for his scheduled appointment today.  Client was oriented times four with good affect and dress.  Client was alert but a bit nervous.  Client communicated that he had used today prior to coming.  Counselor explained to client that if he show up for the appointments high, he would not be allowed to stay.  Client agreed and understood.  Client shared that he had used several hours before coming and was not truly high.  Counselor questioned this and asked what he had taken.  Client indicated that he had smoked a little meth around 5 am.  Client was fidgety but otherwise coherent to what was being said and discussed.  Client openly and honestly talked offering feedback and his own insight to why he is in the predicament he is currently in. Client discussed with counselor how the substance abuse started and how it has evolved over the years.  Client engaged in all topic discussions and invested himself fully.  Client agreed that he needed counseling and a safe place to share freely.  Client made another appointment with counselor for two weeks out.   Rolena Infante, LPCA, MA Alcohol and Drug Services/[facility]

## 2014-12-10 ENCOUNTER — Ambulatory Visit: Payer: Medicaid Other | Admitting: Pharmacist

## 2014-12-10 ENCOUNTER — Other Ambulatory Visit: Payer: Medicaid Other

## 2014-12-10 DIAGNOSIS — K7460 Unspecified cirrhosis of liver: Principal | ICD-10-CM

## 2014-12-10 DIAGNOSIS — B182 Chronic viral hepatitis C: Secondary | ICD-10-CM

## 2014-12-10 LAB — CBC
HEMATOCRIT: 42.3 % (ref 39.0–52.0)
HEMOGLOBIN: 14.3 g/dL (ref 13.0–17.0)
MCH: 30.2 pg (ref 26.0–34.0)
MCHC: 33.8 g/dL (ref 30.0–36.0)
MCV: 89.2 fL (ref 78.0–100.0)
MPV: 11.1 fL (ref 8.6–12.4)
Platelets: 145 10*3/uL — ABNORMAL LOW (ref 150–400)
RBC: 4.74 MIL/uL (ref 4.22–5.81)
RDW: 14.5 % (ref 11.5–15.5)
WBC: 5.1 10*3/uL (ref 4.0–10.5)

## 2014-12-10 NOTE — Addendum Note (Signed)
Addended by: Dinah Beers on: 12/10/2014 10:19 AM   Modules accepted: Orders

## 2014-12-10 NOTE — Progress Notes (Signed)
HPI: Shawn Nguyen is a 59 y.o. male who presents to the [place] clinic for follow-up of his Hepatitis C infection.  Lab Results  Component Value Date   HCVGENOTYPE 1a 09/02/2014   Allergies: No Known Allergies  Past Medical History: Past Medical History  Diagnosis Date  . Hepatitis C     tested + in 07/2013.   Marland Kitchen HTN (hypertension)   . Ascites 03/2013     6 liter paracentesis 03/2013, 9 liter tap 04/2013, 5 liter tap 08/08/13. No SBP on 6/11 tap.  Marland Kitchen Alcoholism   . Gallstones 03/2013    on ultrasound.   Marland Kitchen Umbilical hernia 09/996    draining ascites  . Cirrhosis 06/14/2013  . Esophageal varices 12/25/2013  . 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 Marital Status: Single    Spouse Name: N/A  . Number of Children: 2  . Years of Education: N/A   Social History Main Topics  . Smoking status: Current Some Day Smoker -- 0.25 packs/day    Types: Cigarettes  . Smokeless tobacco: Never Used     Comment: Smoking 1-2 every other day  . Alcohol Use: 0.0 oz/week    0 Standard drinks or equivalent per week     Comment: occasional  . Drug Use: No     Comment: former 2005  . Sexual Activity: Not on file   Other Topics Concern  . 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    Labs: HEP B S AB (no units)  Date Value  09/30/2013 NEG   HEPATITIS B SURFACE AG (no units)  Date Value  09/30/2013 NEGATIVE   HCV AB (no units)  Date Value  05/18/2013 REACTIVE*    Lab Results  Component Value Date   HCVGENOTYPE 1a 09/02/2014    Hepatitis C RNA quantitative Latest Ref Rng 09/02/2014 08/05/2013  HCV Quantitative <15 IU/mL 1004705(H) 970499(H)  HCV Quantitative Log <1.18 log 10 6.00(H) 5.99(H)    AST (U/L)  Date Value  09/02/2014 77*  06/17/2013 42*  05/06/2013 48*   ALT (U/L)  Date Value  09/02/2014 35   06/17/2013 15  05/06/2013 15   INR (ratio)  Date Value  08/05/2013 1.3*   Fibrosis Score: F4 as assessed by elastography   Child-Pugh Score: B  Previous Treatment Regimen: None  Assessment: Mr. Shawn Nguyen is here today to follow-up in the pharmacy clinic for his Hepatitis C genotype 1a infection.  He does have decompensated cirrhosis with associated ascites and varices. He is CP class B.  He is currently taking Harvoni and low-dose ribavirin (600 mg daily) for his infection without problems.  He is taking omeprazole 20 mg daily but states he takes Harvoni, ribavirin, and omeprazole at the same time every morning and waits around 2 hours until he eats.  He doesn't feel tired or have any side effects so far after taking his medications for ~1.5 weeks.  He has not missed any doses and never forgets to take any doses of his medications.  He will get a CBC today and has an appointment with Leveda Anna on 10/27 at 11am - we will schedule a viral load at this appointment too.   Recommendations: Continue taking Harvoni and ribavirin  Will get a CBC today Appointment with Leveda Anna 10/27 at 11am, labs before this  appointment at 10:30  Riona Lahti L. Nicole Kindred, PharmD PGY2 Infectious Diseases Pharmacy Resident Pager: [phone] 12/10/2014 10:03 AM

## 2014-12-25 ENCOUNTER — Other Ambulatory Visit: Payer: Medicaid Other

## 2014-12-25 ENCOUNTER — Ambulatory Visit: Payer: Medicaid Other | Admitting: *Deleted

## 2014-12-25 DIAGNOSIS — B182 Chronic viral hepatitis C: Secondary | ICD-10-CM

## 2014-12-26 LAB — HEPATITIS C RNA QUANTITATIVE: HCV Quantitative: <15 IU/mL (ref <15)

## 2015-01-20 ENCOUNTER — Encounter: Payer: Self-pay | Admitting: Internal Medicine

## 2015-01-20 ENCOUNTER — Ambulatory Visit (INDEPENDENT_AMBULATORY_CARE_PROVIDER_SITE_OTHER): Payer: Medicaid Other | Admitting: Internal Medicine

## 2015-01-20 ENCOUNTER — Other Ambulatory Visit (INDEPENDENT_AMBULATORY_CARE_PROVIDER_SITE_OTHER): Payer: Medicaid Other

## 2015-01-20 VITALS — BP 122/76 | HR 68 | Ht 72.75 in | Wt 184.2 lb

## 2015-01-20 DIAGNOSIS — B182 Chronic viral hepatitis C: Secondary | ICD-10-CM

## 2015-01-20 DIAGNOSIS — K7460 Unspecified cirrhosis of liver: Secondary | ICD-10-CM

## 2015-01-20 DIAGNOSIS — R188 Other ascites: Secondary | ICD-10-CM

## 2015-01-20 LAB — CBC WITH DIFFERENTIAL/PLATELET
BASOS PCT: 0.5 % (ref 0.0–3.0)
Basophils Absolute: 0 10*3/uL (ref 0.0–0.1)
EOS ABS: 0.1 10*3/uL (ref 0.0–0.7)
Eosinophils Relative: 1.3 % (ref 0.0–5.0)
HCT: 41.5 % (ref 39.0–52.0)
Hemoglobin: 13.7 g/dL (ref 13.0–17.0)
Lymphocytes Relative: 41.8 % (ref 12.0–46.0)
Lymphs Abs: 2.7 10*3/uL (ref 0.7–4.0)
MCHC: 33 g/dL (ref 30.0–36.0)
MCV: 90.6 fl (ref 78.0–100.0)
MONO ABS: 0.6 10*3/uL (ref 0.1–1.0)
Monocytes Relative: 10.1 % (ref 3.0–12.0)
NEUTROS ABS: 2.9 10*3/uL (ref 1.4–7.7)
NEUTROS PCT: 46.3 % (ref 43.0–77.0)
Platelets: 177 10*3/uL (ref 150.0–400.0)
RBC: 4.58 Mil/uL (ref 4.22–5.81)
RDW: 14.2 % (ref 11.5–15.5)
WBC: 6.4 10*3/uL (ref 4.0–10.5)

## 2015-01-20 LAB — COMPREHENSIVE METABOLIC PANEL
ALBUMIN: 3.8 g/dL (ref 3.5–5.2)
ALT: 23 U/L (ref 0–53)
AST: 34 U/L (ref 0–37)
Alkaline Phosphatase: 134 U/L — ABNORMAL HIGH (ref 39–117)
BUN: 11 mg/dL (ref 6–23)
CHLORIDE: 102 meq/L (ref 96–112)
CO2: 24 mEq/L (ref 19–32)
Calcium: 9.2 mg/dL (ref 8.4–10.5)
Creatinine, Ser: 0.77 mg/dL (ref 0.40–1.50)
GFR: 133.14 mL/min (ref >60.00)
Glucose, Bld: 82 mg/dL (ref 70–99)
POTASSIUM: 4 meq/L (ref 3.5–5.1)
SODIUM: 135 meq/L (ref 135–145)
Total Bilirubin: 0.8 mg/dL (ref 0.2–1.2)
Total Protein: 8.4 g/dL — ABNORMAL HIGH (ref 6.0–8.3)

## 2015-01-20 NOTE — Patient Instructions (Signed)
You have been scheduled for an abdominal ultrasound at [facility] (1st floor of hospital) on 01-28-2015 at 11am. Please arrive 15 minutes prior to your appointment for registration. Make certain not to have anything to eat or drink 6 hours prior to your appointment. Should you need to reschedule your appointment, please contact radiology at [phone]. This test typically takes about 30 minutes to perform.  Your physician has requested that you go to the basement for the following lab work before leaving today: CBC and CMET  Please call the office at [phone] to update [place] with your medication (Lasix)  Thank you for choosing [facility] and Dr Carlean Purl  for your health care needs!

## 2015-01-20 NOTE — Progress Notes (Signed)
   Subjective:    Patient ID: Shawn Nguyen, male    DOB: 11/03/1955, 59 y.o.   MRN: [id number] Cc: f/u cirrhosis HPI On Tx for HCV now. Feels ok. Not having problems w/ abd girth increase - ascites not a problem On DASH diet Medications, allergies, past medical history, past surgical history, family history and social history are reviewed and updated in the EMR.\ Review of Systems As above    Objective:   Physical Exam _0  122/76 mmHg  Pulse 68  Ht 6' 0.75" (1.848 m)  Wt 184 lb 4 oz (83.575 kg)  BMI 24.47 kg/m2@  General:  NAD Eyes:   anicteric Lungs:  clear Heart:: S1S2 no rubs, murmurs or gallops Abdomen:  soft and nontender, BS+ Ext:   no edema, cyanosis or clubbing    Data Reviewed:   Wt Readings from Last 3 Encounters:  01/20/15 184 lb 4 oz (83.575 kg)  11/06/14 180 lb (81.647 kg)  05/19/14 159 lb 9.6 oz (72.394 kg)          Assessment & Plan:   Chronic hepatitis C with cirrhosis ([place]) - Plan: CBC with Differential/Platelet, Comp Met (CMET), US Abdomen Complete, CANCELED: US ABDOMEN COMPLETE W/ELASTOGRAPHY  Ascites - Plan: CBC with Differential/Platelet, Comp Met (CMET), US Abdomen Complete, CANCELED: US ABDOMEN COMPLETE W/ELASTOGRAPHY   Labs [place] EGD early 2017

## 2015-01-20 NOTE — Assessment & Plan Note (Signed)
US

## 2015-01-20 NOTE — Assessment & Plan Note (Signed)
rechcek early 2017

## 2015-01-21 ENCOUNTER — Encounter: Payer: Self-pay | Admitting: Internal Medicine

## 2015-01-24 NOTE — Progress Notes (Signed)
Quick Note:  Labs look ok I need him to be set up for an EGD to screen/ f/u varices during Jan hosp week please ______

## 2015-01-26 ENCOUNTER — Telehealth: Payer: Self-pay

## 2015-01-26 NOTE — Telephone Encounter (Signed)
Needs furosemide 40 mg daily # 30 5 RF

## 2015-01-26 NOTE — Telephone Encounter (Signed)
Spoke with patient and he is on the Prilosec and spirolactone, no lasix.  Please advise if he needs to be on lasix.  He said he needs refill on Prilosec and fluid pill or pills sent to the [facility].  Thank you.

## 2015-01-26 NOTE — Telephone Encounter (Signed)
Yes refill/rx all 3

## 2015-01-26 NOTE — Telephone Encounter (Signed)
Just to clarify so ok to refill his prilosec and do you want him on the spirolactone as well as the lasix Sir.

## 2015-01-26 NOTE — Telephone Encounter (Signed)
Patient returned phone call. Best # [phone]

## 2015-01-26 NOTE — Telephone Encounter (Signed)
Left message for patient to call me back with the name of his medicine.  At his last office visit with Dr Carlean Purl he was unsure if he was on Lasix or spirolactone.

## 2015-01-27 ENCOUNTER — Other Ambulatory Visit: Payer: Self-pay

## 2015-01-27 DIAGNOSIS — K7469 Other cirrhosis of liver: Secondary | ICD-10-CM

## 2015-01-27 MED ORDER — FUROSEMIDE 40 MG PO TABS: 40.0000 mg | ORAL_TABLET | Freq: Every day | ORAL | Status: DC

## 2015-01-27 MED ORDER — OMEPRAZOLE 20 MG PO CPDR: 20.0000 mg | DELAYED_RELEASE_CAPSULE | Freq: Every day | ORAL | Status: DC

## 2015-01-27 MED ORDER — SPIRONOLACTONE 25 MG PO TABS: 50.0000 mg | ORAL_TABLET | Freq: Every day | ORAL | Status: DC

## 2015-01-27 NOTE — Addendum Note (Signed)
Addended by: [place], Talajah Slimp E on: 01/27/2015 01:56 PM   Modules accepted: Orders

## 2015-01-27 NOTE — Telephone Encounter (Signed)
Please give me the sig for spirolactone Sir, I see multiple ones in the epic.  Thank you.

## 2015-01-27 NOTE — Telephone Encounter (Signed)
Patient informed that he needs to take the lasix, spirolactone and prilosec, rx's sent in for tomorrow pick up.

## 2015-01-27 NOTE — Telephone Encounter (Signed)
25 mg tabs take 2 each AM

## 2015-01-27 NOTE — Addendum Note (Signed)
Addended by: [place], Azaryah Heathcock E on: 01/27/2015 01:06 PM   Modules accepted: Orders

## 2015-01-28 ENCOUNTER — Ambulatory Visit (HOSPITAL_COMMUNITY)
Admission: RE | Admit: 2015-01-28 | Discharge: 2015-01-28 | Disposition: A | Discharge status: Home / Self Care | Payer: Medicaid Other | Source: Ambulatory Visit | Attending: Internal Medicine | Admitting: Internal Medicine

## 2015-01-28 DIAGNOSIS — K7460 Unspecified cirrhosis of liver: Secondary | ICD-10-CM | POA: Insufficient documentation

## 2015-01-28 DIAGNOSIS — B182 Chronic viral hepatitis C: Secondary | ICD-10-CM | POA: Diagnosis not present

## 2015-01-28 DIAGNOSIS — R188 Other ascites: Secondary | ICD-10-CM

## 2015-01-28 NOTE — Progress Notes (Signed)
Quick Note:  [place] ok - stable cirrhosis He will need an EGD possible banding 12/20 possibly at [facility] or in Jan hosp week ______

## 2015-03-03 ENCOUNTER — Encounter: Payer: Self-pay | Admitting: Internal Medicine

## 2015-03-03 ENCOUNTER — Ambulatory Visit (INDEPENDENT_AMBULATORY_CARE_PROVIDER_SITE_OTHER): Payer: Medicaid Other | Admitting: Internal Medicine

## 2015-03-03 VITALS — BP 123/82 | HR 86 | Temp 98.7°F | Ht 72.75 in | Wt 186.4 lb

## 2015-03-03 DIAGNOSIS — F1010 Alcohol abuse, uncomplicated: Secondary | ICD-10-CM | POA: Diagnosis not present

## 2015-03-03 DIAGNOSIS — B182 Chronic viral hepatitis C: Secondary | ICD-10-CM | POA: Diagnosis present

## 2015-03-03 DIAGNOSIS — K7460 Unspecified cirrhosis of liver: Secondary | ICD-10-CM | POA: Diagnosis not present

## 2015-03-03 LAB — CBC WITH DIFFERENTIAL/PLATELET
BASOS ABS: 0 10*3/uL (ref 0.0–0.1)
BASOS PCT: 1 % (ref 0–1)
EOS ABS: 0 10*3/uL (ref 0.0–0.7)
Eosinophils Relative: 1 % (ref 0–5)
HCT: 40.8 % (ref 39.0–52.0)
Hemoglobin: 13.7 g/dL (ref 13.0–17.0)
LYMPHS ABS: 1.5 10*3/uL (ref 0.7–4.0)
Lymphocytes Relative: 33 % (ref 12–46)
MCH: 29.8 pg (ref 26.0–34.0)
MCHC: 33.6 g/dL (ref 30.0–36.0)
MCV: 88.7 fL (ref 78.0–100.0)
MPV: 11.2 fL (ref 8.6–12.4)
Monocytes Absolute: 0.5 10*3/uL (ref 0.1–1.0)
Monocytes Relative: 12 % (ref 3–12)
NEUTROS ABS: 2.3 10*3/uL (ref 1.7–7.7)
NEUTROS PCT: 53 % (ref 43–77)
PLATELETS: 167 10*3/uL (ref 150–400)
RBC: 4.6 MIL/uL (ref 4.22–5.81)
RDW: 14 % (ref 11.5–15.5)
WBC: 4.4 10*3/uL (ref 4.0–10.5)

## 2015-03-03 NOTE — Assessment & Plan Note (Signed)
Still struggling with complete abstinence buy much better.  Seems determined to stop completely.

## 2015-03-03 NOTE — Progress Notes (Signed)
   Subjective:    Patient ID: Shawn Nguyen, male    DOB: 07-Feb-1956, 60 y.o.   MRN: [id number]  HPI He is here for follow-up of hepatitis C.    He has a long history of alcohol abuse and has cirrhosis with child Pugh class B. He was seen by gastroenterology and did have screen for varices. He is on medication for ascites. He has had paracenteses 3 in the past. He currently though is relatively asymptomatic with no belly swelling and is much improved on medication management. He also has been going to see our substance abuse counselor and continues to abstain from alcohol. He feels much better and is pleased with his progress and greatful for hep C treatment and management by Dr. Carlean Purl. [place] screen in November without mass.    Review of Systems  Constitutional: Negative for appetite change, fatigue and unexpected weight change.  Gastrointestinal: Negative for diarrhea and abdominal distention.  Neurological: Negative for dizziness and light-headedness.       Objective:   Physical Exam  Constitutional: He appears well-developed and well-nourished.  Eyes: No scleral icterus.  Cardiovascular: Normal rate, regular rhythm and normal heart sounds.   No murmur heard. Pulmonary/Chest: Effort normal and breath sounds normal. No respiratory distress.  Skin: No rash noted.          Assessment & Plan:

## 2015-03-03 NOTE — Assessment & Plan Note (Signed)
End of treatment lab today and rtc 3-4 months for SVR12.

## 2015-03-04 LAB — HEPATITIS C RNA QUANTITATIVE: HCV QUANT: NOT DETECTED [IU]/mL (ref <15)

## 2015-03-05 ENCOUNTER — Encounter (HOSPITAL_COMMUNITY): Payer: Self-pay | Admitting: *Deleted

## 2015-03-13 IMAGING — US US ABDOMEN LIMITED
1 series · 5 of 5 positions shown · non-contrast
Comparison: 08/08/2013

CLINICAL DATA: Evaluate for possible ascites, hepatitis-C

EXAM:
LIMITED ABDOMEN ULTRASOUND FOR ASCITES
TECHNIQUE: Limited ultrasound survey for ascites was performed in all four
abdominal quadrants.

[Series 1: us abdomen limited · 0.27mm/px · 5 of 5 slices shown]
[im 1/5]
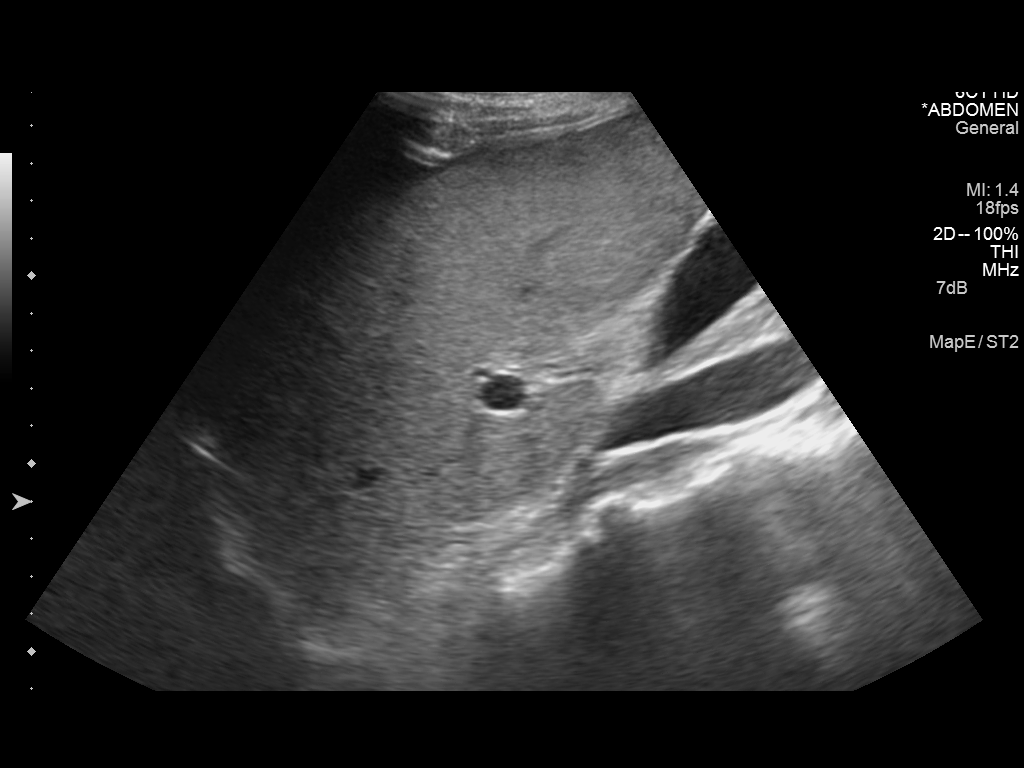
[im 2/5]
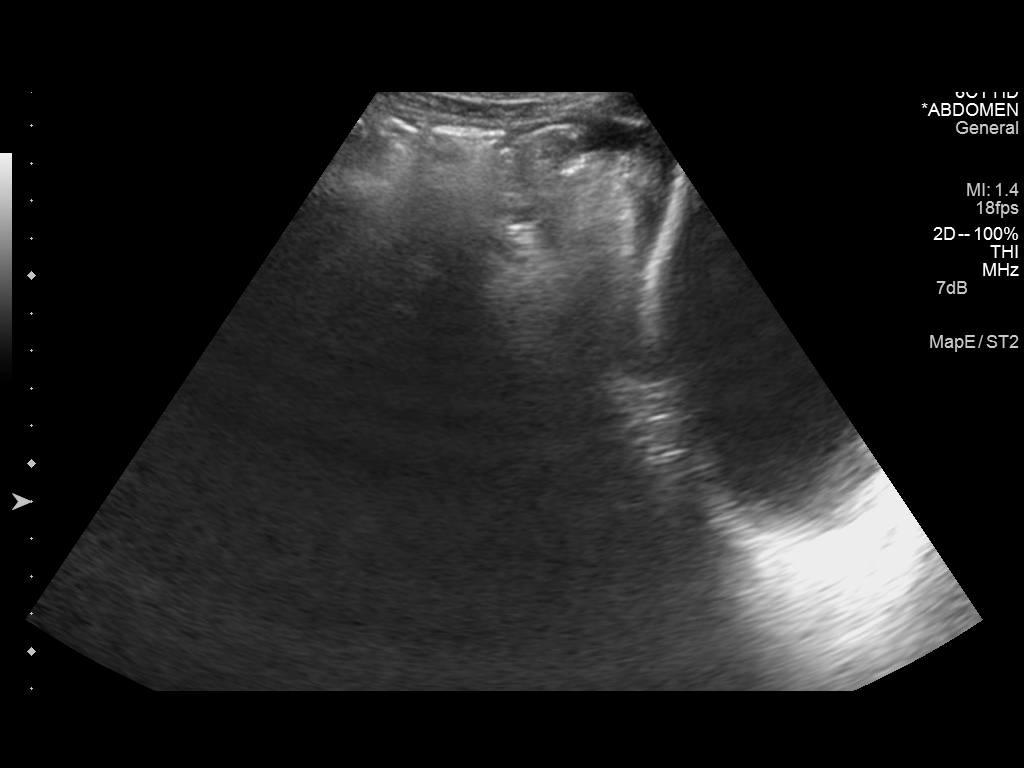
[im 3/5]
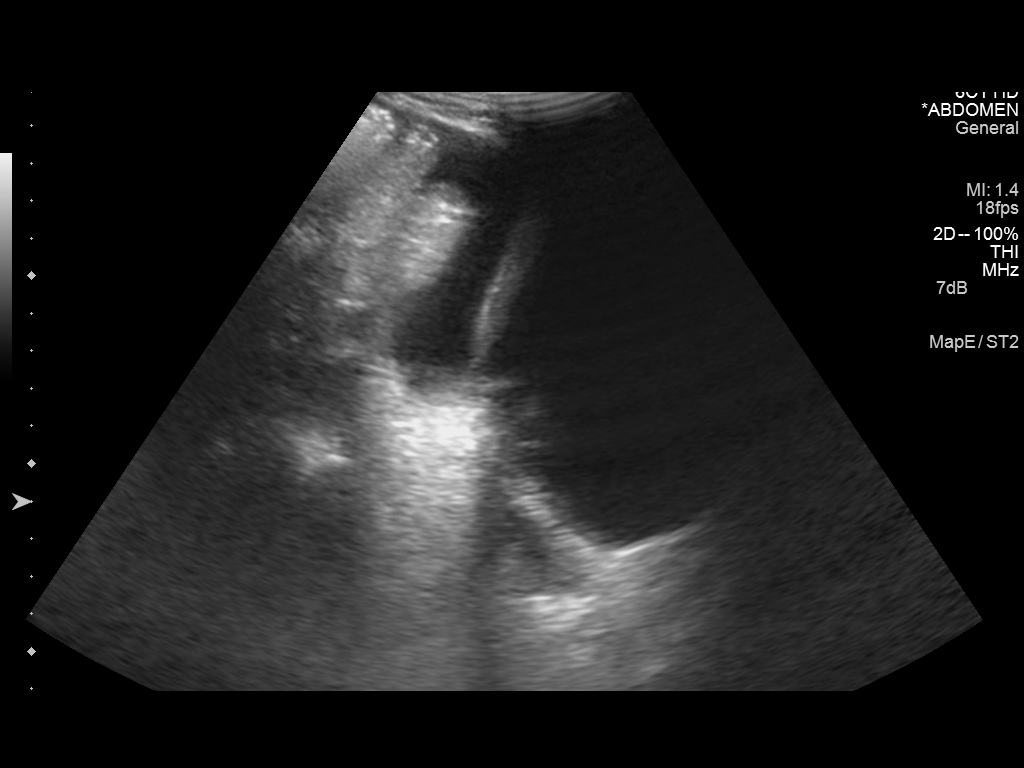
[im 4/5]
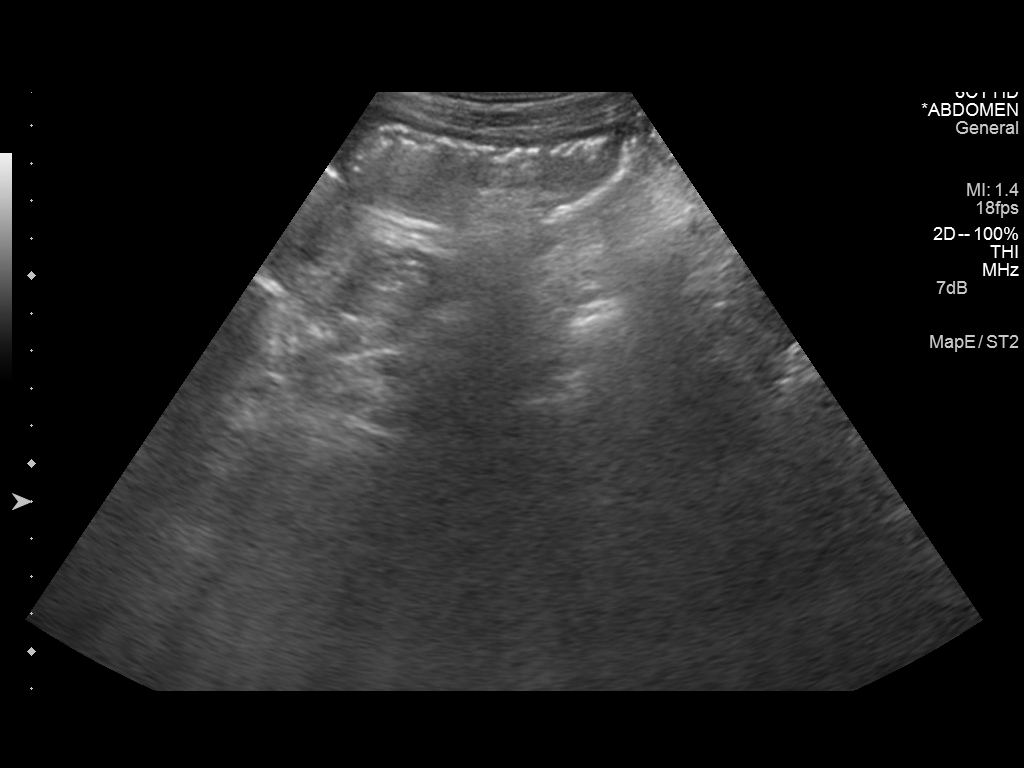
[im 5/5]
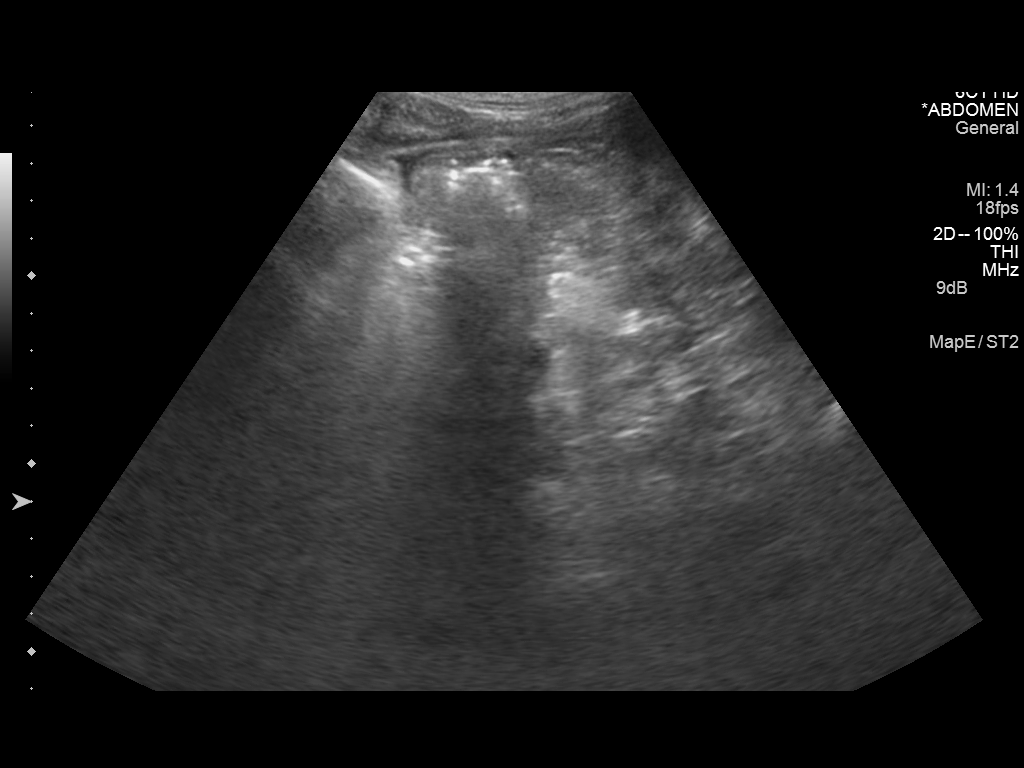

[5 of 5 positions shown; findings below may reference images not displayed]

FINDINGS: Small amount of ascites is identified to predominantly in the left
lower quadrant, insufficient for paracentesis based on real-time
review by Jhonatta Rezende, PA.
IMPRESSION: Small ascites.  No paracentesis performed.

## 2015-03-16 ENCOUNTER — Encounter (HOSPITAL_COMMUNITY): Payer: Self-pay | Admitting: Certified Registered Nurse Anesthetist

## 2015-03-16 ENCOUNTER — Ambulatory Visit (HOSPITAL_COMMUNITY): Payer: Medicaid Other | Admitting: Anesthesiology

## 2015-03-16 ENCOUNTER — Ambulatory Visit (HOSPITAL_COMMUNITY)
Admission: RE | Admit: 2015-03-16 | Discharge: 2015-03-16 | Disposition: A | Discharge status: Home / Self Care | Payer: Medicaid Other | Source: Ambulatory Visit | Attending: Internal Medicine | Admitting: Internal Medicine

## 2015-03-16 ENCOUNTER — Encounter (HOSPITAL_COMMUNITY)
Admission: RE | Disposition: A | Discharge status: Home / Self Care | Payer: Self-pay | Source: Ambulatory Visit | Attending: Internal Medicine

## 2015-03-16 DIAGNOSIS — F17210 Nicotine dependence, cigarettes, uncomplicated: Secondary | ICD-10-CM | POA: Diagnosis not present

## 2015-03-16 DIAGNOSIS — Z79899 Other long term (current) drug therapy: Secondary | ICD-10-CM | POA: Diagnosis not present

## 2015-03-16 DIAGNOSIS — I10 Essential (primary) hypertension: Secondary | ICD-10-CM | POA: Insufficient documentation

## 2015-03-16 DIAGNOSIS — K429 Umbilical hernia without obstruction or gangrene: Secondary | ICD-10-CM | POA: Insufficient documentation

## 2015-03-16 DIAGNOSIS — K7469 Other cirrhosis of liver: Secondary | ICD-10-CM | POA: Diagnosis not present

## 2015-03-16 DIAGNOSIS — K7460 Unspecified cirrhosis of liver: Secondary | ICD-10-CM | POA: Diagnosis not present

## 2015-03-16 DIAGNOSIS — R188 Other ascites: Secondary | ICD-10-CM | POA: Insufficient documentation

## 2015-03-16 DIAGNOSIS — Z1389 Encounter for screening for other disorder: Secondary | ICD-10-CM | POA: Diagnosis not present

## 2015-03-16 DIAGNOSIS — K449 Diaphragmatic hernia without obstruction or gangrene: Secondary | ICD-10-CM | POA: Insufficient documentation

## 2015-03-16 DIAGNOSIS — B1920 Unspecified viral hepatitis C without hepatic coma: Secondary | ICD-10-CM | POA: Insufficient documentation

## 2015-03-16 DIAGNOSIS — K219 Gastro-esophageal reflux disease without esophagitis: Secondary | ICD-10-CM | POA: Insufficient documentation

## 2015-03-16 HISTORY — PX: ESOPHAGOGASTRODUODENOSCOPY (EGD) WITH PROPOFOL: SHX5813

## 2015-03-16 SURGERY — ESOPHAGOGASTRODUODENOSCOPY (EGD) WITH PROPOFOL
Anesthesia: Monitor Anesthesia Care

## 2015-03-16 MED ORDER — LACTATED RINGERS IV SOLN
INTRAVENOUS | Status: DC | PRN
  Administered 2015-03-16: 09:00:00 via INTRAVENOUS

## 2015-03-16 MED ORDER — BUTAMBEN-TETRACAINE-BENZOCAINE 2-2-14 % EX AERO
INHALATION_SPRAY | CUTANEOUS | Status: DC | PRN
  Administered 2015-03-16: 2 via TOPICAL

## 2015-03-16 MED ORDER — LACTATED RINGERS IV SOLN
INTRAVENOUS | Status: DC
  Administered 2015-03-16: 1000 mL via INTRAVENOUS

## 2015-03-16 MED ORDER — PROPOFOL 10 MG/ML IV BOLUS
INTRAVENOUS | Status: AC
  Filled 2015-03-16: qty 40

## 2015-03-16 MED ORDER — PROPOFOL 10 MG/ML IV BOLUS
INTRAVENOUS | Status: DC | PRN
  Administered 2015-03-16: 20 mg via INTRAVENOUS
  Administered 2015-03-16: 40 mg via INTRAVENOUS
  Administered 2015-03-16: 20 mg via INTRAVENOUS
  Administered 2015-03-16: 40 mg via INTRAVENOUS
  Administered 2015-03-16: 20 mg via INTRAVENOUS

## 2015-03-16 MED ORDER — SODIUM CHLORIDE 0.9 % IV SOLN: INTRAVENOUS | Status: DC

## 2015-03-16 SURGICAL SUPPLY — 14 items

## 2015-03-16 NOTE — H&P (Signed)
[facility] History and Physical   Primary Care Physician:  Angelica Chessman, MD   Reason for Procedure:   Cirrhosis, f/u esophageal varices  Plan:    EGD Barb Merino      HPI: Shawn Nguyen is a 60 y.o. male with HCV cirrhosis and hx esophageal varices here for f/u of esophageal varices (no hx bleeding).   Past Medical History  Diagnosis Date  . Hepatitis C     tested + in 07/2013.   Marland Kitchen HTN (hypertension)   . Ascites 03/2013     6 liter paracentesis 03/2013, 9 liter tap 04/2013, 5 liter tap 08/08/13. No SBP on 6/11 tap.  Marland Kitchen Alcoholism ([place])   . Gallstones 03/2013    on ultrasound.   Marland Kitchen Umbilical hernia [id number]    draining ascites  . Cirrhosis ([place]) 06/14/2013  . Esophageal varices ([place]) 12/25/2013  . Rectal varices - small 12/25/2013    Past Surgical History  Procedure Laterality Date  . Colonoscopy N/A 12/25/2013    Procedure: COLONOSCOPY;  Surgeon: Gatha Mayer, MD;  Location: [facility];  Service: Endoscopy;  Laterality: N/A;  . Esophagogastroduodenoscopy N/A 12/25/2013    Procedure: ESOPHAGOGASTRODUODENOSCOPY (EGD);  Surgeon: Gatha Mayer, MD;  Location: [facility];  Service: Endoscopy;  Laterality: N/A;    Prior to Admission medications   Medication Sig Start Date End Date Taking? Authorizing Provider  furosemide (LASIX) 40 MG tablet Take 1 tablet (40 mg total) by mouth daily. 01/27/15  Yes Gatha Mayer, MD  Ledipasvir-Sofosbuvir (HARVONI) 90-400 MG TABS Take 1 tablet by mouth daily. 11/06/14  Yes Thayer Headings, MD  omeprazole (PRILOSEC) 20 MG capsule Take 1 capsule (20 mg total) by mouth daily before breakfast. 01/27/15  Yes Gatha Mayer, MD  ribavirin (COPEGUS) 200 MG tablet Take 3 tablets (600 mg total) by mouth daily. 11/06/14  Yes Thayer Headings, MD  spironolactone (ALDACTONE) 25 MG tablet Take 2 tablets (50 mg total) by mouth daily. 01/27/15  Yes Gatha Mayer, MD    Current Facility-Administered Medications  Medication Dose Route  Frequency Provider Last Rate Last Dose  . 0.9 %  sodium chloride infusion   Intravenous Continuous Gatha Mayer, MD      . lactated ringers infusion   Intravenous Continuous Gatha Mayer, MD 125 mL/hr at 03/16/15 0813 1,000 mL at 03/16/15 0813   Facility-Administered Medications Ordered in Other Encounters  Medication Dose Route Frequency Provider Last Rate Last Dose  . lactated ringers infusion    Continuous PRN Ofilia Neas, CRNA        Allergies as of 01/27/2015  . (No Known Allergies)    Family History  Problem Relation Age of Onset  . Diabetes Father     Social History   Social History  . Marital Status: Single    Spouse Name: N/A  . Number of Children: 2  . Years of Education: N/A   Occupational History  . Not on file.   Social History Main Topics  . Smoking status: Current Some Day Smoker -- 0.25 packs/day    Types: Cigarettes  . Smokeless tobacco: Never Used     Comment: Smoking 1-2 every other day  . Alcohol Use: 0.0 oz/week    0 Standard drinks or equivalent per week     Comment: occasional  . Drug Use: No     Comment: former 2005  . Sexual Activity: Not on file   Other Topics Concern  . Not on file  Social History Narrative   single, 1 son one daughter, formerly worked as a Biochemist, clinical, he has been unemployed for a number of years.    Review of Systems: Positive for ascites All other review of systems negative except as mentioned in the HPI.  Physical Exam: Vital signs in last 24 hours: Temp:  [99.1 F (37.3 C)] 99.1 F (37.3 C) (01/16 0808) Pulse Rate:  [68] 68 (01/16 0808) Resp:  [17] 17 (01/16 0808) BP: (166)/(98) 166/98 mmHg (01/16 0808) SpO2:  [100 %] 100 % (01/16 0808) Weight:  [186 lb (84.369 kg)] 186 lb (84.369 kg) (01/16 [id number])   General:   Alert,  Well-developed, well-nourished, pleasant and cooperative in NAD Lungs:  Clear throughout to auscultation.   Heart:  Regular rate and rhythm; no murmurs, clicks, rubs,  or  gallops. Abdomen:  Soft, nontender and mild - mod distended. Normal bowel sounds.   Neuro/Psych:  Alert and cooperative. Normal mood and affect. A and O x 3   @Chermaine Schnyder  Simonne Maffucci, MD, [facility] [phone] (pager) 03/16/2015 9:31 AM@

## 2015-03-16 NOTE — Op Note (Signed)
[facility] [place], [address]   ENDOSCOPY PROCEDURE REPORT  PATIENT: Chick, Felgar  MR#: [id number] BIRTHDATE: Jul 16, 1955 , 33  yrs. old GENDER: male ENDOSCOPIST: Gatha Mayer, MD, [facility] PROCEDURE DATE:  03/16/2015 PROCEDURE:  EGD, diagnostic ASA CLASS:     Class III INDICATIONS:  varices screening in cirrhosis. MEDICATIONS: Monitored anesthesia care and Per Anesthesia TOPICAL ANESTHETIC: none  DESCRIPTION OF PROCEDURE: After the risks benefits and alternatives of the procedure were thoroughly explained, informed consent was obtained.  The Pentax Gastroscope [id number] endoscope was introduced through the mouth and advanced to the second portion of the duodenum , Without limitations.  The instrument was slowly withdrawn as the mucosa was fully examined.    1) No obvious varices today 2) Small hiatal hernia 3) Otherwise normal EGD.  Retroflexed views revealed as previously described. The scope was then withdrawn from the patient and the procedure completed.  COMPLICATIONS: There were no immediate complications.  ENDOSCOPIC IMPRESSION: 1) No obvious varices today 2) Small hiatal hernia 3) Otherwise normal EGD  RECOMMENDATIONS: Continue current Rx - on PPI for GERD and diutretics for ascites HCV is cleared  REPEAT EXAM: Recall EGD 1 year and Recall OV 6 months   eSigned:  Gatha Mayer, MD, [facility] 03/16/2015 10:30 AM    CC: The patient

## 2015-03-16 NOTE — Discharge Instructions (Signed)
° °  Things looked good today. Remember to stay off alcohol. I plan to check the esophagus again in 1 year.  I should see you in the summer - will try to send a reminder.  I appreciate the opportunity to care for you. Gatha Mayer, MD, [facility]

## 2015-03-16 NOTE — Anesthesia Postprocedure Evaluation (Signed)
Anesthesia Post Note  Patient: Shawn Nguyen  Procedure(s) Performed: Procedure(s) (LRB): ESOPHAGOGASTRODUODENOSCOPY (EGD) WITH PROPOFOL (N/A)  Patient location during evaluation: PACU Anesthesia Type: MAC Level of consciousness: awake and alert Pain management: pain level controlled Vital Signs Assessment: post-procedure vital signs reviewed and stable Respiratory status: spontaneous breathing, nonlabored ventilation, respiratory function stable and patient connected to nasal cannula oxygen Cardiovascular status: blood pressure returned to baseline and stable Postop Assessment: no signs of nausea or vomiting Anesthetic complications: no    Last Vitals:  Filed Vitals:   03/16/15 1040 03/16/15 1048  BP: 160/107 156/89  Pulse: 57 59  Temp:    Resp: 14 18    Last Pain: There were no vitals filed for this visit.               Dorisann Schwanke JENNETTE

## 2015-03-16 NOTE — Transfer of Care (Signed)
Immediate Anesthesia Transfer of Care Note  Patient: Shawn Nguyen  Procedure(s) Performed: Procedure(s): ESOPHAGOGASTRODUODENOSCOPY (EGD) WITH PROPOFOL (N/A)  Patient Location: PACU and Endoscopy Unit  Anesthesia Type:MAC  Level of Consciousness: awake, alert , oriented, patient cooperative and responds to stimulation  Airway & Oxygen Therapy: Patient Spontanous Breathing and Patient connected to nasal cannula oxygen  Post-op Assessment: Report given to RN and Post -op Vital signs reviewed and stable  Post vital signs: Reviewed and stable  Last Vitals:  Filed Vitals:   03/16/15 0808  BP: 166/98  Pulse: 68  Temp: 37.3 C  Resp: 17    Complications: No apparent anesthesia complications

## 2015-03-16 NOTE — Anesthesia Preprocedure Evaluation (Addendum)
Anesthesia Evaluation  Patient identified by MRN, date of birth, ID band Patient awake    Reviewed: Allergy & Precautions, NPO status , Patient's Chart, lab work & pertinent test results  History of Anesthesia Complications Negative for: history of anesthetic complications  Airway Mallampati: II  TM Distance: >3 FB Neck ROM: Full    Dental no notable dental hx. (+) Poor Dentition, Missing, Chipped   Pulmonary Current Smoker,    Pulmonary exam normal breath sounds clear to auscultation       Cardiovascular hypertension, Pt. on medications Normal cardiovascular exam Rhythm:Regular Rate:Normal     Neuro/Psych negative neurological ROS  negative psych ROS   GI/Hepatic negative GI ROS, (+)     substance abuse  alcohol use, Hepatitis -, C  Endo/Other  negative endocrine ROS  Renal/GU negative Renal ROS  negative genitourinary   Musculoskeletal negative musculoskeletal ROS (+)   Abdominal   Peds negative pediatric ROS (+)  Hematology negative hematology ROS (+)   Anesthesia Other Findings   Reproductive/Obstetrics negative OB ROS                            Anesthesia Physical Anesthesia Plan  ASA: III  Anesthesia Plan: MAC   Post-op Pain Management:    Induction: Intravenous  Airway Management Planned: Nasal Cannula  Additional Equipment:   Intra-op Plan:   Post-operative Plan:   Informed Consent: I have reviewed the patients History and Physical, chart, labs and discussed the procedure including the risks, benefits and alternatives for the proposed anesthesia with the patient or authorized representative who has indicated his/her understanding and acceptance.   Dental advisory given  Plan Discussed with: CRNA  Anesthesia Plan Comments:         Anesthesia Quick Evaluation

## 2015-03-17 ENCOUNTER — Encounter (HOSPITAL_COMMUNITY): Payer: Self-pay | Admitting: Internal Medicine

## 2015-03-17 MED FILL — SPIRONOLACTONE 25 MG TABLET: 25 | 30 days supply | Qty: 60 | Fill #1

## 2015-03-17 MED FILL — FUROSEMIDE 40 MG TABLET: 40 | 30 days supply | Qty: 30 | Fill #1

## 2015-03-17 MED FILL — OMEPRAZOLE DR 20 MG CAPSULE: 20 | 30 days supply | Qty: 30 | Fill #1

## 2015-05-07 IMAGING — US US ABDOMEN COMPLETE W/ ELASTOGRAPHY
1 series · 12 of 12 positions shown · non-contrast
Comparison: Abdominal ultrasound on 04/22/2013

CLINICAL DATA: Chronic hepatitis-C.  Cirrhosis.



[Series 1: us abdomen complete w/ elastography · 0.20mm/px · 12 of 12 slices shown]
[im 1/12]
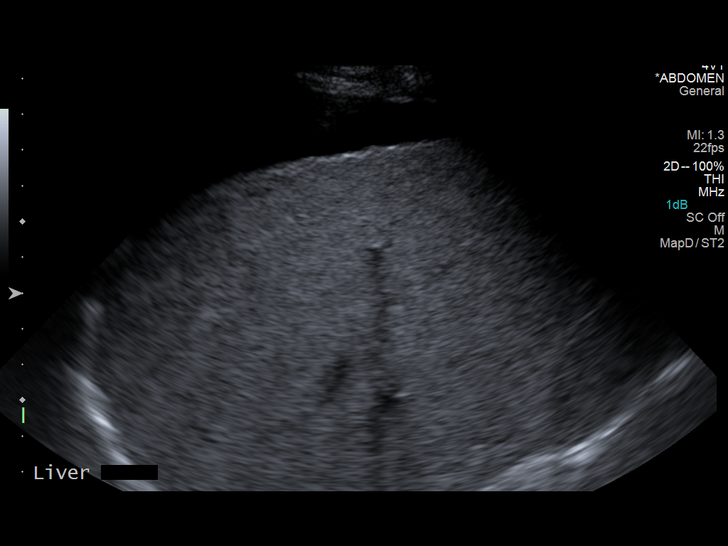
[im 2/12]
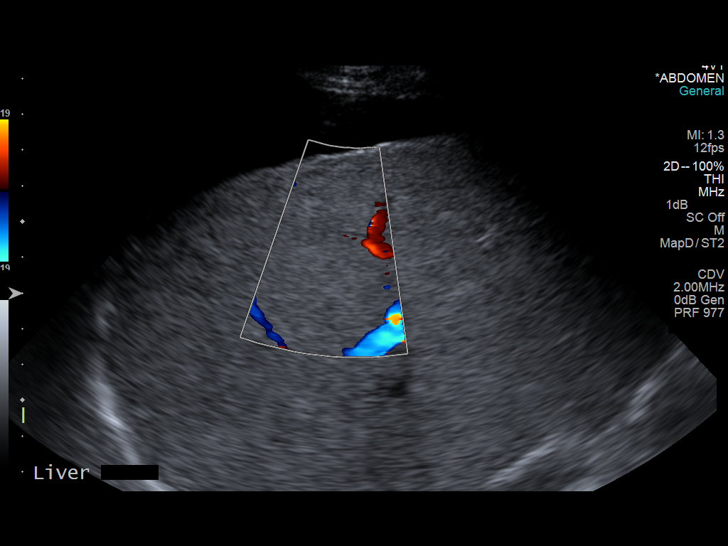
[im 3/12]
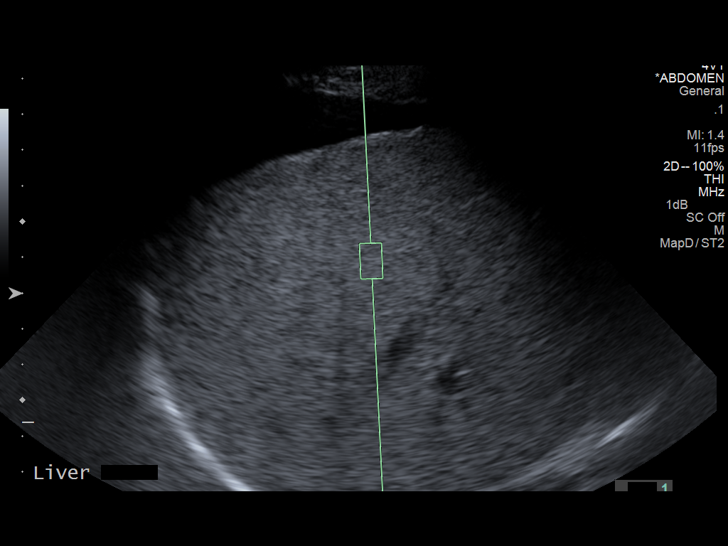
[im 4/12]
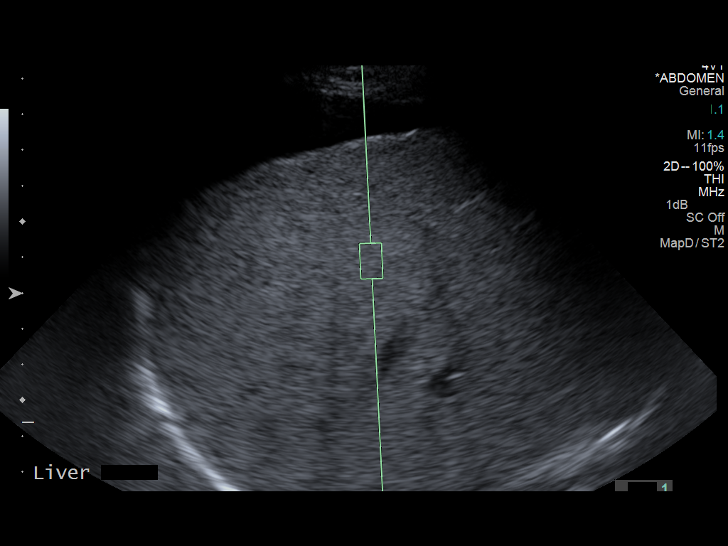
[im 5/12]
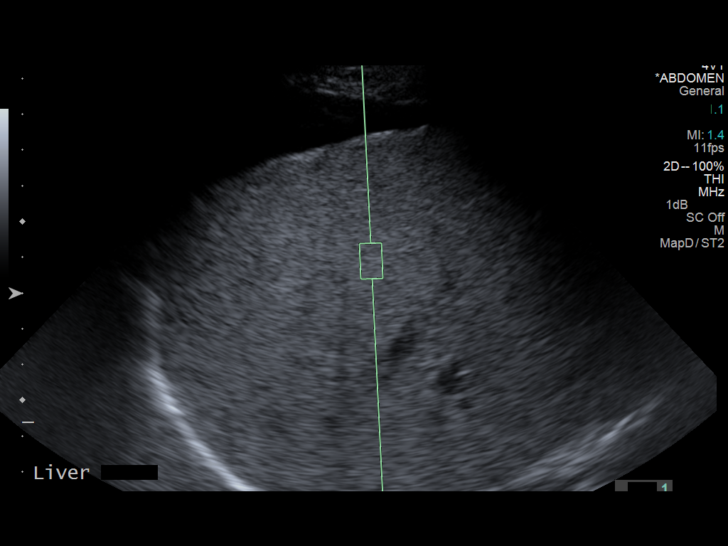
[im 6/12]
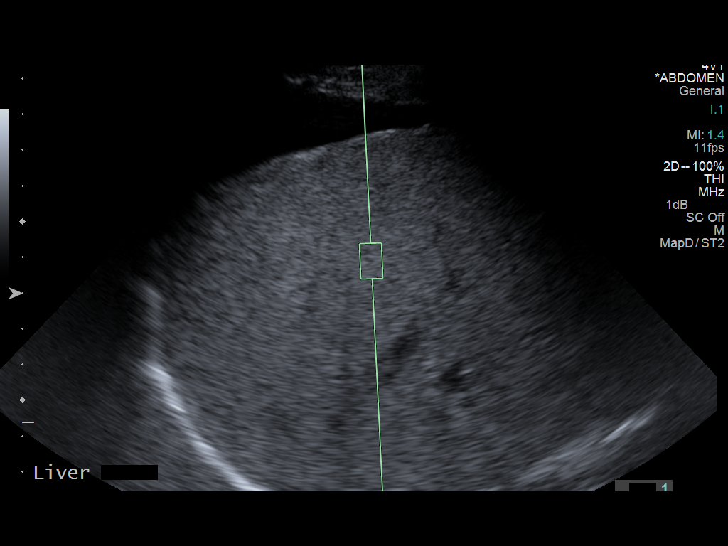
[im 7/12]
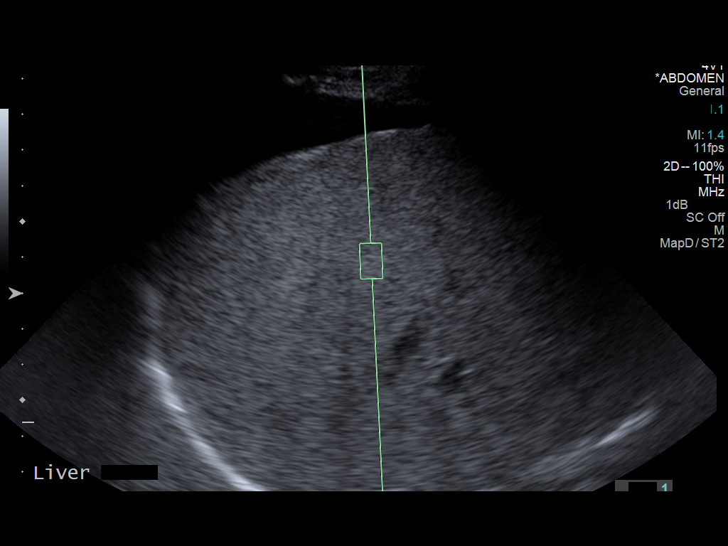
[im 8/12]
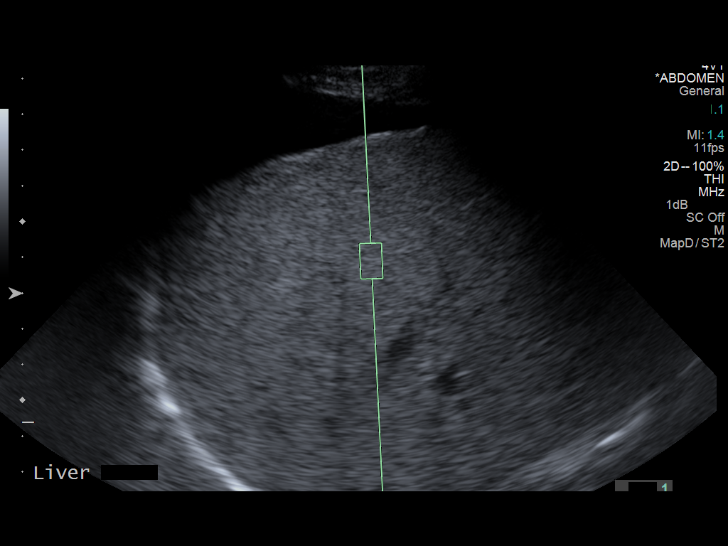
[im 9/12]
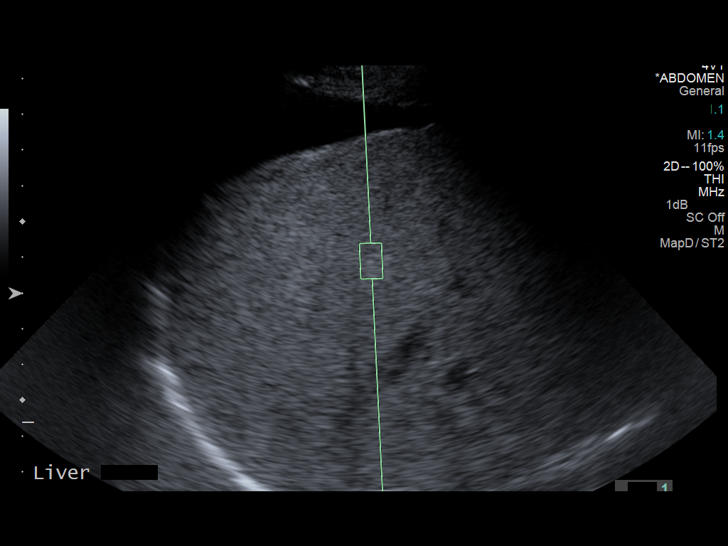
[im 10/12]
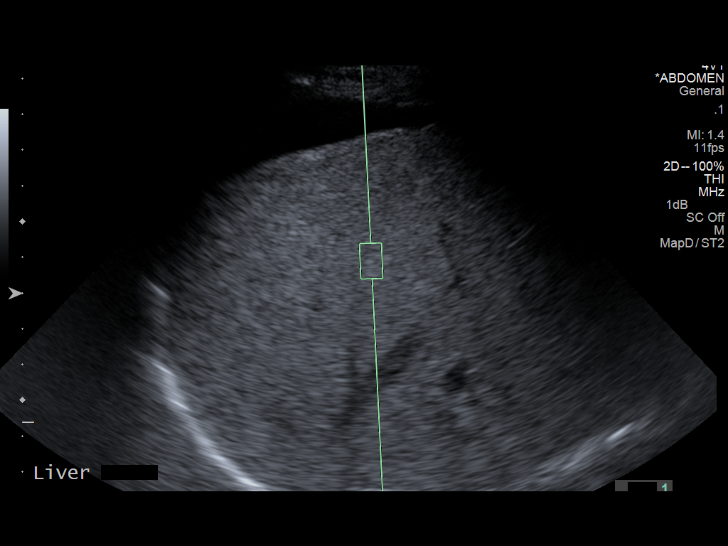
[im 11/12]
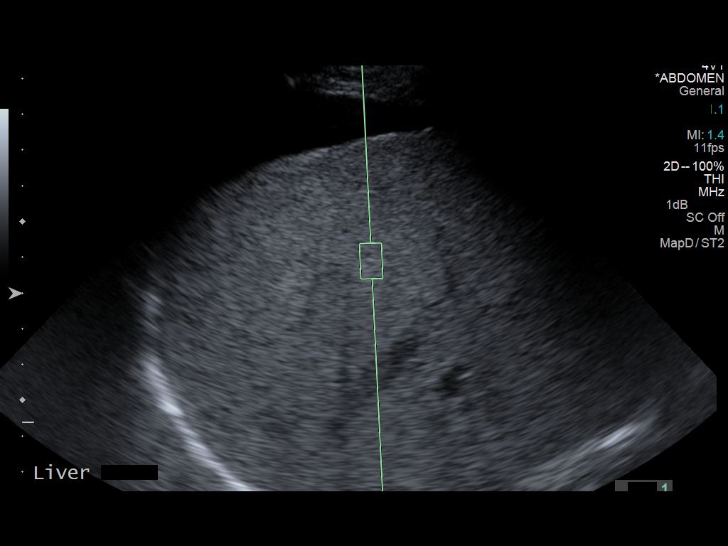
[im 12/12]
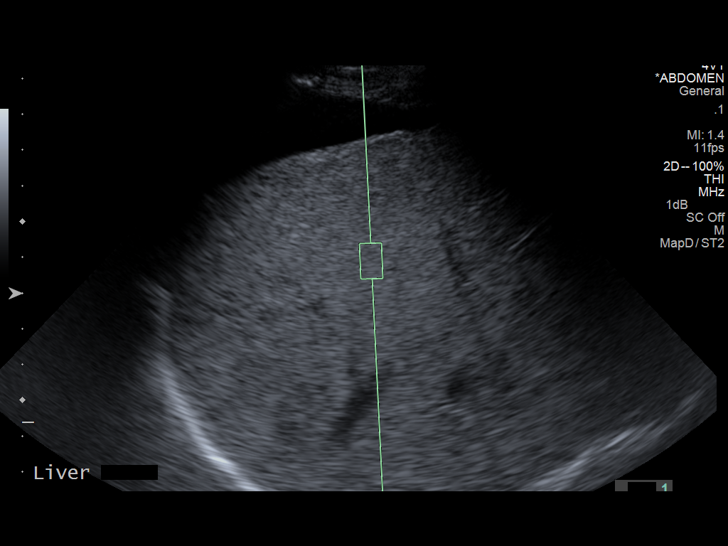

[12 of 12 positions shown; findings below may reference images not displayed]

FINDINGS: ULTRASOUND ABDOMEN

Gallbladder:

Several small less than 1 cm gallstones are seen. Diffuse
gallbladder wall thickening is seen measuring up to 6 mm. No
sonographic Murphy sign noted by sonographer.

Common bile duct:

Diameter: 5 mm

Liver:

Nodular capsular contour noted as well as mildly increased
parenchymal echogenicity, consistent with hepatic cirrhosis. No
liver masses identified.

IVC:

No abnormality visualized.

Pancreas:

Visualized portion unremarkable.

Spleen:

Size and appearance within normal limits.

Right Kidney:

Length: 11.9 cm. Echogenicity within normal limits. No mass or
hydronephrosis visualized.

Left Kidney:

Length: 11.8 cm. Echogenicity within normal limits. No mass or
hydronephrosis visualized.

Abdominal aorta:

No aneurysm visualized.

Other findings:

Moderate ascites noted.

ULTRASOUND HEPATIC ELASTOGRAPHY

Device: Siemens Helix VTQ

Transducer 4V

Patient position: Left lateral decubitus

Hepatic Segment:  8

Number of measurements:  10

Median velocity:   3.2  m/sec

IQR/Median velocity ratio 0.7.

Corresponding Metavir fibrosis score:  F4

Limitations of exam: Moderate ascites

Pertinent findings noted on other imaging exams:  None
IMPRESSION: Cholelithiasis.  No evidence of biliary dilatation.

Diffuse gallbladder wall thickening, which is likely secondary to
hepatocellular disease in the absence of a sonographic Murphy's
sign. Suggest clinical correlation for signs or symptoms of
cholecystitis.

Hepatic cirrhosis and moderate ascites.  No liver mass identified.

Median hepatic shear wave velocity is calculated at 3.2 m/sec.

Corresponding Metavir fibrosis score is F4.

Please note that abnormal shear wave velocities may also be
identified in clinical settings other than with hepatic fibrosis,
such as: acute hepatitis, elevated right heart and central venous
pressures, Djibril disease (Jim), infiltrative
processes such as mastocytosis/amyloidosis/infiltrative tumor,
extrahepatic cholestasis, in the post-prandial state, and liver
transplantation. Correlation with patient history, laboratory data,
and clinical condition recommended.

## 2015-06-04 ENCOUNTER — Ambulatory Visit (INDEPENDENT_AMBULATORY_CARE_PROVIDER_SITE_OTHER): Payer: Medicaid Other | Admitting: Internal Medicine

## 2015-06-04 ENCOUNTER — Encounter: Payer: Self-pay | Admitting: Internal Medicine

## 2015-06-04 VITALS — BP 149/88 | HR 73 | Temp 98.3°F | Ht 72.0 in | Wt 183.0 lb

## 2015-06-04 DIAGNOSIS — B182 Chronic viral hepatitis C: Secondary | ICD-10-CM

## 2015-06-04 DIAGNOSIS — K7460 Unspecified cirrhosis of liver: Secondary | ICD-10-CM | POA: Diagnosis not present

## 2015-06-04 DIAGNOSIS — F1010 Alcohol abuse, uncomplicated: Secondary | ICD-10-CM

## 2015-06-04 DIAGNOSIS — Z23 Encounter for immunization: Secondary | ICD-10-CM

## 2015-06-04 NOTE — Assessment & Plan Note (Signed)
Has follow up with GI next month and getting [place] screening through them.

## 2015-06-04 NOTE — Progress Notes (Signed)
   Subjective:    Patient ID: Shawn Nguyen, male    DOB: 1955/12/10, 60 y.o.   MRN: [id number]  HPI He is here for follow-up of hepatitis C.     He has a long history of alcohol abuse and has cirrhosis with child Pugh class B. He was seen by gastroenterology and did have screen for varices. He is on medication for ascites. He has had paracenteses 3 in the past. He currently though is relatively asymptomatic with no belly swelling and is much improved on medication management. He also has been going to see our substance abuse counselor and continues to try to abstain.  He has not had anything since [place] won.  He feels much better and is pleased with his progress and greatful for hep C treatment and management by Dr. Carlean Purl. [place] screen in November without mass. Says he is following up with Dr. Carlean Purl again next month.  Here for [facility] today to confirm cure.     Review of Systems  Constitutional: Negative for appetite change, fatigue and unexpected weight change.  Gastrointestinal: Negative for diarrhea and abdominal distention.  Neurological: Negative for dizziness and light-headedness.       Objective:   Physical Exam  Constitutional: He appears well-developed and well-nourished.  Eyes: No scleral icterus.  Cardiovascular: Normal rate, regular rhythm and normal heart sounds.   No murmur heard. Pulmonary/Chest: Effort normal and breath sounds normal. No respiratory distress.  Skin: No rash noted.   Social History   Social History  . Marital Status: Single    Spouse Name: N/A  . Number of Children: 2  . Years of Education: N/A   Occupational History  . Not on file.   Social History Main Topics  . Smoking status: Current Some Day Smoker -- 0.25 packs/day    Types: Cigarettes  . Smokeless tobacco: Never Used     Comment: Smoking 1-2 every other day  . Alcohol Use: 0.0 oz/week    0 Standard drinks or equivalent per week     Comment: occasional  . Drug Use: No   Comment: former 2005  . Sexual Activity: Not on file   Other Topics Concern  . 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         Assessment & Plan:

## 2015-06-04 NOTE — Assessment & Plan Note (Signed)
Encouraged continued work on abstinence.  He again seems motivated and feels better.

## 2015-06-04 NOTE — Addendum Note (Signed)
Addended by: Landis Gandy on: 06/04/2015 05:14 PM   Modules accepted: Orders

## 2015-06-04 NOTE — Assessment & Plan Note (Signed)
SVR 12 today and does not need to return unless detectable again.

## 2015-06-04 NOTE — Addendum Note (Signed)
Addended by: Landis Gandy on: 06/04/2015 05:11 PM   Modules accepted: Orders

## 2015-06-05 LAB — HEPATITIS C RNA QUANTITATIVE
HCV Quantitative Log: 6 {Log} — ABNORMAL HIGH (ref <1.18)
HCV Quantitative: 1003730 IU/mL — ABNORMAL HIGH (ref <15)

## 2015-06-15 ENCOUNTER — Telehealth: Payer: Self-pay | Admitting: *Deleted

## 2015-06-15 NOTE — Telephone Encounter (Signed)
Left message for patient to call back regarding test results and to make a lab appointment.  Asked him to call triage at [phone] or [facility] at [phone]. Landis Gandy, RN

## 2015-06-15 NOTE — Telephone Encounter (Signed)
-----   Message from Thayer Headings, MD sent at 06/15/2015  9:57 AM EDT ----- He unfortunately has relapsed with his hepatitis C. Can you let him know and get him in to see Minh to discuss and do NS5A test and genotype.   Thanks

## 2015-06-19 ENCOUNTER — Ambulatory Visit: Payer: Medicaid Other | Admitting: Pharmacist Clinician (PhC)/ Clinical Pharmacy Specialist

## 2015-06-19 DIAGNOSIS — B182 Chronic viral hepatitis C: Secondary | ICD-10-CM

## 2015-06-22 ENCOUNTER — Ambulatory Visit: Payer: Medicaid Other

## 2015-06-23 ENCOUNTER — Telehealth: Payer: Self-pay | Admitting: Internal Medicine

## 2015-06-23 ENCOUNTER — Ambulatory Visit: Payer: Medicaid Other | Admitting: Pharmacist Clinician (PhC)/ Clinical Pharmacy Specialist

## 2015-06-23 ENCOUNTER — Other Ambulatory Visit: Payer: Medicaid Other

## 2015-06-23 DIAGNOSIS — K7460 Unspecified cirrhosis of liver: Principal | ICD-10-CM

## 2015-06-23 DIAGNOSIS — B182 Chronic viral hepatitis C: Secondary | ICD-10-CM

## 2015-06-23 NOTE — Progress Notes (Signed)
HPI: Shawn Nguyen is a 60 y.o. male who is here to discuss about his re-treatment of his Hep C.   Lab Results  Component Value Date   HCVGENOTYPE 1a 09/02/2014    Allergies: No Known Allergies  Vitals:    Past Medical History: Past Medical History  Diagnosis Date  . Hepatitis C     tested + in 07/2013.   Marland Kitchen HTN (hypertension)   . Ascites 03/2013     6 liter paracentesis 03/2013, 9 liter tap 04/2013, 5 liter tap 08/08/13. No SBP on 6/11 tap.  Marland Kitchen Alcoholism ([place])   . Gallstones 03/2013    on ultrasound.   Marland Kitchen Umbilical hernia [id number]    draining ascites  . Cirrhosis ([place]) 06/14/2013  . Esophageal varices ([place]) 12/25/2013  . 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 Marital Status: Single    Spouse Name: N/A  . Number of Children: 2  . Years of Education: N/A   Social History Main Topics  . Smoking status: Current Some Day Smoker -- 0.25 packs/day    Types: Cigarettes  . Smokeless tobacco: Never Used     Comment: Smoking 1-2 every other day  . Alcohol Use: 0.0 oz/week    0 Standard drinks or equivalent per week     Comment: occasional  . Drug Use: No     Comment: former 2005  . Sexual Activity: Not on file   Other Topics Concern  . 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    Labs: HEP B S AB (no units)  Date Value  09/30/2013 NEG   HEPATITIS B SURFACE AG (no units)  Date Value  09/30/2013 NEGATIVE   HCV AB (no units)  Date Value  05/18/2013 REACTIVE*    Lab Results  Component Value Date   HCVGENOTYPE 1a 09/02/2014    Hepatitis C RNA quantitative Latest Ref Rng 06/04/2015 03/03/2015 12/25/2014 09/02/2014 08/05/2013  HCV Quantitative <15 IU/mL 1003730(H) Not Detected <15 1004705(H) 970499(H)  HCV Quantitative Log <1.18 log 10 6.00(H) NOT CALC <1.18 6.00(H) 5.99(H)    AST (U/L)  Date Value   01/20/2015 34  09/02/2014 77*  06/17/2013 42*   ALT (U/L)  Date Value  01/20/2015 23  09/02/2014 35  06/17/2013 15   INR (ratio)  Date Value  08/05/2013 1.3*    CrCl: CrCl cannot be calculated (Unknown ideal weight.).  Fibrosis Score: F4 as assessed by ARFI   Child-Pugh Score: Class A  Previous Treatment Regimen: Harvoni  Assessment: Shawn Nguyen is here after he failed his treatment with Harvoni. Shawn Nguyen did confirm that he did finish 3 months of Harvoni and ribavirin. He stated that he hasn't drink since the [facility] championship game. Explained to him about some of the reason why he possibly could have failed. He has decompensated cirrhosis, therefore, all protease inhibitors will be out the window due to the risk of hepatic toxicity. After discussing with Dr. Linus Salmons, we are going use Epclusa + riba x 24 weeks. We are going to get NS5A and NS3 resistance testing today. Gave him some bus passes for transportation.   Recommendations:  NS5A, NS3 resistance testing today Potential Epclusa/riba x 24 wks  Shawn Nguyen [place], [place].D., BCPS, AAHIVP Clinical Infectious [place] for Infectious Disease 06/23/2015, 10:00 AM

## 2015-06-23 NOTE — Telephone Encounter (Signed)
Pt called to let us know that his Hep C is back and he needs to be retreated. Pt was seen by ID today and will be treated this time with the following:  After discussing with Dr. Linus Salmons, we are going use Epclusa + riba x 24 weeks. We are going to get NS5A and NS3 resistance testing today.    Dr. Carlean Purl aware.

## 2015-06-27 LAB — HCV RNA NS5A DRUG RESISTANCE

## 2015-06-30 ENCOUNTER — Other Ambulatory Visit: Payer: Self-pay | Admitting: Pharmacist Clinician (PhC)/ Clinical Pharmacy Specialist

## 2015-06-30 MED ORDER — RIBAVIRIN 200 MG PO CAPS: 600.0000 mg | ORAL_CAPSULE | Freq: Two times a day (BID) | ORAL | Status: DC

## 2015-06-30 MED ORDER — SOFOSBUVIR-VELPATASVIR 400-100 MG PO TABS: 1.0000 | ORAL_TABLET | Freq: Every day | ORAL | Status: DC

## 2015-06-30 MED FILL — RIBAVIRIN 200 MG TABLET: 200 | 28 days supply | Qty: 168 | Fill #0

## 2015-07-01 MED FILL — EPCLUSA 400 MG-100 MG TAB: 400-100 | 28 days supply | Qty: 28 | Fill #0

## 2015-07-03 ENCOUNTER — Telehealth: Payer: Self-pay | Admitting: *Deleted

## 2015-07-03 NOTE — Telephone Encounter (Signed)
Patient called for his Hep C results. Viral load still pending. He is going away this weekend and will call when he returns to find out when he needs to follow back up. He wanted Dr. Linus Salmons to know that he feels like this office has taken very good care of him and provided him with "excellent service".

## 2015-07-06 ENCOUNTER — Ambulatory Visit (INDEPENDENT_AMBULATORY_CARE_PROVIDER_SITE_OTHER): Payer: Medicaid Other | Admitting: Pharmacist Clinician (PhC)/ Clinical Pharmacy Specialist

## 2015-07-06 DIAGNOSIS — B182 Chronic viral hepatitis C: Secondary | ICD-10-CM

## 2015-07-06 DIAGNOSIS — K7460 Unspecified cirrhosis of liver: Secondary | ICD-10-CM

## 2015-07-06 MED FILL — OMEPRAZOLE DR 20 MG CAPSULE: 20 | 30 days supply | Qty: 30 | Fill #2

## 2015-07-06 NOTE — Progress Notes (Signed)
Patient ID: Shawn Nguyen, male   DOB: 11-Feb-1956, 60 y.o.   MRN: [id number] HPI: Shawn Nguyen is a 60 y.o. male who is here to pick up his meds for the hep c retreatment.   Lab Results  Component Value Date   HCVGENOTYPE 1a 09/02/2014    Allergies: No Known Allergies  Vitals:    Past Medical History: Past Medical History  Diagnosis Date  . Hepatitis C     tested + in 07/2013.   Marland Kitchen HTN (hypertension)   . Ascites 03/2013     6 liter paracentesis 03/2013, 9 liter tap 04/2013, 5 liter tap 08/08/13. No SBP on 6/11 tap.  Marland Kitchen Alcoholism ([place])   . Gallstones 03/2013    on ultrasound.   Marland Kitchen Umbilical hernia [id number]    draining ascites  . Cirrhosis ([place]) 06/14/2013  . Esophageal varices ([place]) 12/25/2013  . 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 Marital Status: Single    Spouse Name: N/A  . Number of Children: 2  . Years of Education: N/A   Social History Main Topics  . Smoking status: Current Some Day Smoker -- 0.25 packs/day    Types: Cigarettes  . Smokeless tobacco: Never Used     Comment: Smoking 1-2 every other day  . Alcohol Use: 0.0 oz/week    0 Standard drinks or equivalent per week     Comment: occasional  . Drug Use: No     Comment: former 2005  . Sexual Activity: Not on file   Other Topics Concern  . 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    Labs: HEP B S AB (no units)  Date Value  09/30/2013 NEG   HEPATITIS B SURFACE AG (no units)  Date Value  09/30/2013 NEGATIVE   HCV AB (no units)  Date Value  05/18/2013 REACTIVE*    Lab Results  Component Value Date   HCVGENOTYPE 1a 09/02/2014    Hepatitis C RNA quantitative Latest Ref Rng 06/04/2015 03/03/2015 12/25/2014 09/02/2014 08/05/2013  HCV Quantitative <15 IU/mL 1003730(H) Not Detected <15 1004705(H) 970499(H)  HCV Quantitative Log <1.18 log 10  6.00(H) NOT CALC <1.18 6.00(H) 5.99(H)    AST (U/L)  Date Value  01/20/2015 34  09/02/2014 77*  06/17/2013 42*   ALT (U/L)  Date Value  01/20/2015 23  09/02/2014 35  06/17/2013 15   INR (ratio)  Date Value  08/05/2013 1.3*    CrCl: CrCl cannot be calculated (Unknown ideal weight.).  Fibrosis Score: F4 as assessed by ARFI  Child-Pugh Score: Class B  Previous Treatment Regimen: Harvoni/riba  Assessment: Shawn Nguyen recently failed Harvoni/riba after completing 12 wks of therapy. His case is a bit challenging because he is Child-pugh class B. We have decided to re-treat him with Epclusa/riba x 6 months. He is also on Prilosec so we brought him to explain how to take the regimen. Prilosec has to be taken 4 hrs after the epclusa. Made him a med calendar today with schedule time of each meds. He understood and will follow the directions on it. We are going to bring him back in 2 wks to check for CBC and plan monitor him closely while he is on this regimen.   Recommendations:  Start Epclusa 1 PO qday x 6 mo with breakfast Start Ribavirin 600mg  PO BID  x 6 mo with breakfast and dinner Take omeprazole 20mg  4hr after the hep C meds at noon F/u in 2 weeks  [name] [place], [place].D., BCPS, AAHIVP Clinical Infectious [place] for Infectious [facility] 07/06/2015, 2:24 PM

## 2015-07-06 NOTE — Patient Instructions (Addendum)
Start Epclusa 1 tablet with breakfast Start Ribavirin 3 tablets with breakfast and dinner  Take Omeprazole at [employer]

## 2015-07-09 LAB — HEPATITIS C VIRAL RNA NS3 GENOTYPE

## 2015-07-21 ENCOUNTER — Other Ambulatory Visit: Payer: Medicaid Other

## 2015-07-21 ENCOUNTER — Ambulatory Visit (INDEPENDENT_AMBULATORY_CARE_PROVIDER_SITE_OTHER): Payer: Medicaid Other | Admitting: Pharmacist Clinician (PhC)/ Clinical Pharmacy Specialist

## 2015-07-21 DIAGNOSIS — B182 Chronic viral hepatitis C: Secondary | ICD-10-CM

## 2015-07-21 LAB — CBC
HCT: 39.6 % (ref 38.5–50.0)
HEMOGLOBIN: 13.1 g/dL — AB (ref 13.2–17.1)
MCH: 29.1 pg (ref 27.0–33.0)
MCHC: 33.1 g/dL (ref 32.0–36.0)
MCV: 88 fL (ref 80.0–100.0)
MPV: 10.9 fL (ref 7.5–12.5)
Platelets: 185 10*3/uL (ref 140–400)
RBC: 4.5 MIL/uL (ref 4.20–5.80)
RDW: 14 % (ref 11.0–15.0)
WBC: 5.1 10*3/uL (ref 3.8–10.8)

## 2015-07-21 NOTE — Patient Instructions (Signed)
Cont Epclusa and ribavirin F/u with pharmacy in 2 weeks

## 2015-07-21 NOTE — Progress Notes (Signed)
Patient ID: Shawn Nguyen, male   DOB: 1955-09-28, 60 y.o.   MRN: [id number] HPI: Shawn Nguyen is a 60 y.o. male who is here for his pharmacy f/u of treating hep C failure.   Lab Results  Component Value Date   HCVGENOTYPE 1a 09/02/2014    Allergies: No Known Allergies  Vitals:    Past Medical History: Past Medical History  Diagnosis Date  . Hepatitis C     tested + in 07/2013.   Marland Kitchen HTN (hypertension)   . Ascites 03/2013     6 liter paracentesis 03/2013, 9 liter tap 04/2013, 5 liter tap 08/08/13. No SBP on 6/11 tap.  Marland Kitchen Alcoholism ([place])   . Gallstones 03/2013    on ultrasound.   Marland Kitchen Umbilical hernia [id number]    draining ascites  . Cirrhosis ([place]) 06/14/2013  . Esophageal varices ([place]) 12/25/2013  . 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 Marital Status: Single    Spouse Name: N/A  . Number of Children: 2  . Years of Education: N/A   Social History Main Topics  . Smoking status: Current Some Day Smoker -- 0.25 packs/day    Types: Cigarettes  . Smokeless tobacco: Never Used     Comment: Smoking 1-2 every other day  . Alcohol Use: 0.0 oz/week    0 Standard drinks or equivalent per week     Comment: occasional  . Drug Use: No     Comment: former 2005  . Sexual Activity: Not on file   Other Topics Concern  . 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    Labs: HEP B S AB (no units)  Date Value  09/30/2013 NEG   HEPATITIS B SURFACE AG (no units)  Date Value  09/30/2013 NEGATIVE   HCV AB (no units)  Date Value  05/18/2013 REACTIVE*    Lab Results  Component Value Date   HCVGENOTYPE 1a 09/02/2014    Hepatitis C RNA quantitative Latest Ref Rng 06/04/2015 03/03/2015 12/25/2014 09/02/2014 08/05/2013  HCV Quantitative <15 IU/mL 1003730(H) Not Detected <15 1004705(H) 970499(H)  HCV Quantitative Log <1.18 log 10  6.00(H) NOT CALC <1.18 6.00(H) 5.99(H)    AST (U/L)  Date Value  01/20/2015 34  09/02/2014 77*  06/17/2013 42*   ALT (U/L)  Date Value  01/20/2015 23  09/02/2014 35  06/17/2013 15   INR (ratio)  Date Value  08/05/2013 1.3*    CrCl: CrCl cannot be calculated (Unknown ideal weight.).  Fibrosis Score: F4 as assessed by ARFI  Child-Pugh Score: Class B  Previous Treatment Regimen: Harvoni/Riba  Assessment: Shawn Nguyen started his salvage regimen of Epclusa/Riba on 5/8. Even though I stressed it to him multiple times of how to take the regimen (even with calendar), he took his epclusa BID. He has not had any side effects or any missing doses so far. He has about 2-3 pills left this month. This is where we figured it out that he has been taking it BID. Medicaid has stated that it's being refilled too soon and it can be filled again in the next 1-2 days. Had to stress it to him on how to take it again. He still has the calendar at home. Going to bring him back in 2 wks for labs and adherence check up.  Recommendations:  Epclusa 1  PO qday Ribavirin 600mg  BID CBC today F/u 2 weeks for lab and adherence check up  Shawn Nguyen [place], [place].D., BCPS, AAHIVP Clinical Infectious [place] for Infectious Disease 07/21/2015, 11:13 AM

## 2015-07-22 MED FILL — EPCLUSA 400 MG-100 MG TAB: 400-100 | 28 days supply | Qty: 28 | Fill #1

## 2015-07-28 MED FILL — RIBAVIRIN 200 MG TABLET: 200 | 28 days supply | Qty: 168 | Fill #1

## 2015-08-04 ENCOUNTER — Other Ambulatory Visit: Payer: Medicaid Other

## 2015-08-04 ENCOUNTER — Ambulatory Visit: Payer: Medicaid Other | Admitting: Pharmacist Clinician (PhC)/ Clinical Pharmacy Specialist

## 2015-08-04 VITALS — Wt 188.0 lb

## 2015-08-04 DIAGNOSIS — B182 Chronic viral hepatitis C: Secondary | ICD-10-CM

## 2015-08-04 LAB — COMPREHENSIVE METABOLIC PANEL
ALT: 18 U/L (ref 9–46)
AST: 34 U/L (ref 10–35)
Albumin: 3.8 g/dL (ref 3.6–5.1)
Alkaline Phosphatase: 142 U/L — ABNORMAL HIGH (ref 40–115)
BUN: 9 mg/dL (ref 7–25)
CO2: 20 mmol/L (ref 20–31)
Calcium: 8.8 mg/dL (ref 8.6–10.3)
Chloride: 102 mmol/L (ref 98–110)
Creat: 0.9 mg/dL (ref 0.70–1.33)
Glucose, Bld: 100 mg/dL — ABNORMAL HIGH (ref 65–99)
Potassium: 4.2 mmol/L (ref 3.5–5.3)
Sodium: 135 mmol/L (ref 135–146)
Total Bilirubin: 1.1 mg/dL (ref 0.2–1.2)
Total Protein: 7.7 g/dL (ref 6.1–8.1)

## 2015-08-04 LAB — CBC
HCT: 38 % — ABNORMAL LOW (ref 38.5–50.0)
Hemoglobin: 12.5 g/dL — ABNORMAL LOW (ref 13.2–17.1)
MCH: 29.6 pg (ref 27.0–33.0)
MCHC: 32.9 g/dL (ref 32.0–36.0)
MCV: 89.8 fL (ref 80.0–100.0)
MPV: 11 fL (ref 7.5–12.5)
Platelets: 189 10*3/uL (ref 140–400)
RBC: 4.23 MIL/uL (ref 4.20–5.80)
RDW: 14 % (ref 11.0–15.0)
WBC: 5.3 10*3/uL (ref 3.8–10.8)

## 2015-08-04 NOTE — Progress Notes (Signed)
HPI: Shawn Nguyen is a 60 y.o. male who is here for his pharmacy visit for his hep C retreatment.   Lab Results  Component Value Date   HCVGENOTYPE 1a 09/02/2014    Allergies: No Known Allergies  Vitals:    Past Medical History: Past Medical History  Diagnosis Date  . Hepatitis C     tested + in 07/2013.   Marland Kitchen HTN (hypertension)   . Ascites 03/2013     6 liter paracentesis 03/2013, 9 liter tap 04/2013, 5 liter tap 08/08/13. No SBP on 6/11 tap.  Marland Kitchen Alcoholism ([place])   . Gallstones 03/2013    on ultrasound.   Marland Kitchen Umbilical hernia [id number]    draining ascites  . Cirrhosis ([place]) 06/14/2013  . Esophageal varices ([place]) 12/25/2013  . 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 Marital Status: Single    Spouse Name: N/A  . Number of Children: 2  . Years of Education: N/A   Social History Main Topics  . Smoking status: Current Some Day Smoker -- 0.25 packs/day    Types: Cigarettes  . Smokeless tobacco: Never Used     Comment: Smoking 1-2 every other day  . Alcohol Use: 0.0 oz/week    0 Standard drinks or equivalent per week     Comment: occasional  . Drug Use: No     Comment: former 2005  . Sexual Activity: Not on file   Other Topics Concern  . 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    Labs: HEP B S AB (no units)  Date Value  09/30/2013 NEG   HEPATITIS B SURFACE AG (no units)  Date Value  09/30/2013 NEGATIVE   HCV AB (no units)  Date Value  05/18/2013 REACTIVE*    Lab Results  Component Value Date   HCVGENOTYPE 1a 09/02/2014    Hepatitis C RNA quantitative Latest Ref Rng 06/04/2015 03/03/2015 12/25/2014 09/02/2014 08/05/2013  HCV Quantitative <15 IU/mL 1003730(H) Not Detected <15 1004705(H) 970499(H)  HCV Quantitative Log <1.18 log 10 6.00(H) NOT CALC <1.18 6.00(H) 5.99(H)    AST (U/L)  Date Value   01/20/2015 34  09/02/2014 77*  06/17/2013 42*   ALT (U/L)  Date Value  01/20/2015 23  09/02/2014 35  06/17/2013 15   INR (ratio)  Date Value  08/05/2013 1.3*    CrCl: CrCl cannot be calculated (Patient has no serum creatinine result on file.).  Fibrosis Score: F4  Child-Pugh Score: Class B  Previous Treatment Regimen: Harvoni/riba  Assessment: Shawn Nguyen is about a month into his re-treatment of his hep C. Previously, he was taking his Epclusa BID for about 2 wks at that last visit. He is taking everything correctly now. He logged into my chart to see his hemoglobin and was excited to see it being stable. He has not missed any doses and doesn't have any side effects. He is about to run out of his riba. Called the pharmacy and he can pick it up after lab today. We are going to check his VL and CBC today for the medicaid extension.   Recommendations:  Cont Epclusa 1 PO qday x 6 months Cont Ribavirin 600mg  BID CBC, VL today F/u with labs in 1 mo F/u with Dr. Linus Salmons in Aug  Mahaley Schwering [place], [place].D., BCPS, AAHIVP Clinical Infectious Disease Pharmacist  [place] for Infectious [facility] 08/04/2015, 9:46 AM

## 2015-08-05 LAB — HEPATITIS C RNA QUANTITATIVE: HCV Quantitative: NOT DETECTED IU/mL (ref <15)

## 2015-08-18 MED FILL — EPCLUSA 400 MG-100 MG TAB: 400-100 | 28 days supply | Qty: 28 | Fill #2

## 2015-08-21 MED FILL — OMEPRAZOLE DR 20 MG CAPSULE: 20 | 30 days supply | Qty: 30 | Fill #3

## 2015-08-24 MED FILL — RIBAVIRIN 200 MG TABLET: 200 | 28 days supply | Qty: 168 | Fill #2

## 2015-09-03 ENCOUNTER — Other Ambulatory Visit: Payer: Medicaid Other

## 2015-09-03 DIAGNOSIS — K7460 Unspecified cirrhosis of liver: Secondary | ICD-10-CM

## 2015-09-03 DIAGNOSIS — B182 Chronic viral hepatitis C: Secondary | ICD-10-CM

## 2015-09-03 LAB — CBC
HCT: 38.4 % — ABNORMAL LOW (ref 38.5–50.0)
HEMOGLOBIN: 12 g/dL — AB (ref 13.2–17.1)
MCH: 29.4 pg (ref 27.0–33.0)
MCHC: 31.3 g/dL — ABNORMAL LOW (ref 32.0–36.0)
MCV: 94.1 fL (ref 80.0–100.0)
MPV: 11 fL (ref 7.5–12.5)
Platelets: 159 10*3/uL (ref 140–400)
RBC: 4.08 MIL/uL — AB (ref 4.20–5.80)
RDW: 15 % (ref 11.0–15.0)
WBC: 4.3 10*3/uL (ref 3.8–10.8)

## 2015-09-06 LAB — HEPATITIS C RNA QUANTITATIVE: HCV Quantitative: NOT DETECTED IU/mL (ref <15)

## 2015-09-14 MED FILL — EPCLUSA 400 MG-100 MG TAB: 400-100 | 28 days supply | Qty: 28 | Fill #3

## 2015-09-21 MED FILL — OMEPRAZOLE DR 20 MG CAPSULE: 20 | 30 days supply | Qty: 30 | Fill #4

## 2015-10-01 ENCOUNTER — Ambulatory Visit: Payer: Medicaid Other | Admitting: Internal Medicine

## 2015-10-06 MED FILL — EPCLUSA 400 MG-100 MG TAB: 400-100 | 28 days supply | Qty: 28 | Fill #4

## 2015-10-06 MED FILL — RIBAVIRIN 200 MG TABLET: 200 | 28 days supply | Qty: 168 | Fill #3

## 2015-10-28 MED FILL — RIBAVIRIN 200 MG TABLET: 200 | 28 days supply | Qty: 168 | Fill #4

## 2015-10-30 MED FILL — OMEPRAZOLE DR 20 MG CAPSULE: 20 | 30 days supply | Qty: 30 | Fill #5

## 2015-11-03 ENCOUNTER — Ambulatory Visit (INDEPENDENT_AMBULATORY_CARE_PROVIDER_SITE_OTHER): Payer: Medicaid Other | Admitting: Internal Medicine

## 2015-11-03 ENCOUNTER — Other Ambulatory Visit: Payer: Self-pay | Admitting: Internal Medicine

## 2015-11-03 VITALS — BP 128/86 | HR 90 | Temp 98.0°F | Wt 186.0 lb

## 2015-11-03 DIAGNOSIS — K7460 Unspecified cirrhosis of liver: Secondary | ICD-10-CM | POA: Diagnosis not present

## 2015-11-03 DIAGNOSIS — B182 Chronic viral hepatitis C: Secondary | ICD-10-CM

## 2015-11-03 DIAGNOSIS — I8510 Secondary esophageal varices without bleeding: Secondary | ICD-10-CM

## 2015-11-03 NOTE — Assessment & Plan Note (Addendum)
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  I will order ultrasound and he will be sure he continues to follow Dr. Carlean Purl.    I will check his end of treatment viral load in December and then SVR 24 6 months later.  He will follow up with Pharm D.

## 2015-11-03 NOTE — Assessment & Plan Note (Signed)
Followed by GI

## 2015-11-03 NOTE — Progress Notes (Signed)
   Subjective:    Patient ID: Shawn Nguyen, male    DOB: [birth year], 60 y.o.   MRN: [id number]  HPI He is here for follow-up of hepatitis C.     He has a long history of alcohol abuse and has cirrhosis with child Pugh class B. He was seen by gastroenterology and did have screen for varices. He is on medication for ascites. He has had paracenteses 3 in the past. He currently though is relatively asymptomatic with no belly swelling and is much improved on medication management.  He feels much better and is pleased with his progress and greatful for hep C treatment and management by Dr. Carlean Purl. [place] screen in November 2016 without mass but has not had a recent ultrasound since.  On salvage regimen with Epclusa and weight based ribavirin for 24 weeks, finishing in November.  No missed doses.  Stopped drinking any alcohol/beer and stopped smoking.  Viral load negative so far.    Review of Systems  Constitutional: Negative for appetite change, fatigue and unexpected weight change.  Gastrointestinal: Negative for abdominal distention and diarrhea.  Neurological: Negative for dizziness and light-headedness.       Objective:   Physical Exam  Constitutional: He appears well-developed and well-nourished.  Eyes: No scleral icterus.  Cardiovascular: Normal rate, regular rhythm and normal heart sounds.   No murmur heard. Pulmonary/Chest: Effort normal and breath sounds normal. No respiratory distress.  Skin: No rash noted.   Social History   Social History  . Marital status: Single    Spouse name: N/A  . Number of children: 2  . Years of education: N/A   Occupational History  . Not on file.   Social History Main Topics  . Smoking status: Current Some Day Smoker    Packs/day: 0.25    Types: Cigarettes  . Smokeless tobacco: Never Used     Comment: Smoking 1-2 every other day  . Alcohol use 0.0 oz/week     Comment: occasional  . Drug use: No     Comment: former 2005  . Sexual  activity: Not on file   Other Topics Concern  . 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         Assessment & Plan:

## 2015-11-04 ENCOUNTER — Telehealth: Payer: Self-pay | Admitting: *Deleted

## 2015-11-04 NOTE — Telephone Encounter (Signed)
Multiple attempts made to notify patient of ultrasound appt and no answer and no voice mail. Called his sister, emergency contact and asked that she return my call. Ultrasound scheduled for 11/10/15 at 10:10 AM at [place], [facility]. Nothing to eat or drink after midnight. PA [id number] expires 12/03/15. Myrtis Hopping

## 2015-11-06 MED FILL — EPCLUSA 400 MG-100 MG TAB: 400-100 | 28 days supply | Qty: 28 | Fill #5

## 2015-11-10 ENCOUNTER — Ambulatory Visit
Admission: RE | Admit: 2015-11-10 | Discharge: 2015-11-10 | Disposition: A | Discharge status: Home / Self Care | Payer: Medicaid Other | Source: Ambulatory Visit | Attending: Internal Medicine | Admitting: Internal Medicine

## 2015-11-10 DIAGNOSIS — B182 Chronic viral hepatitis C: Secondary | ICD-10-CM

## 2015-11-24 MED FILL — RIBAVIRIN 200 MG TABLET: 200 | 28 days supply | Qty: 168 | Fill #5

## 2016-02-02 ENCOUNTER — Other Ambulatory Visit: Payer: Medicaid Other

## 2016-02-02 DIAGNOSIS — B182 Chronic viral hepatitis C: Secondary | ICD-10-CM

## 2016-02-02 DIAGNOSIS — K7460 Unspecified cirrhosis of liver: Principal | ICD-10-CM

## 2016-02-04 LAB — HEPATITIS C RNA QUANTITATIVE: HCV Quantitative: NOT DETECTED IU/mL (ref <15)

## 2016-02-09 ENCOUNTER — Ambulatory Visit: Payer: Medicaid Other

## 2016-02-16 ENCOUNTER — Ambulatory Visit: Payer: Medicaid Other

## 2016-04-06 ENCOUNTER — Other Ambulatory Visit: Payer: Self-pay

## 2017-03-21 ENCOUNTER — Telehealth: Payer: Self-pay | Admitting: Internal Medicine

## 2017-03-21 NOTE — Telephone Encounter (Signed)
Patient called requesting to speak with you regarding his medicaid cards. Patient also had a few other questions to ask you, please fu with patient at your earliest convinece.

## 2017-04-20 IMAGING — US US ABDOMEN LIMITED
1 series · 14 of 25 positions shown · non-contrast
Comparison: Multiple priors, most recent 01/28/2015.

CLINICAL DATA: Hepatitis-C with cirrhosis.  Continued surveillance.

EXAM:
US ABDOMEN LIMITED - RIGHT UPPER QUADRANT

[Series 1: us abdomen limited · 0.26mm/px · 14 of 46 slices shown]
[im 1/46]
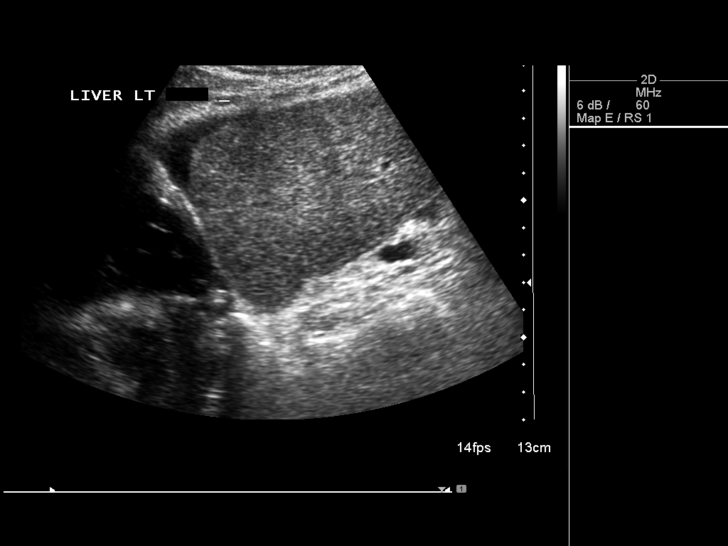
[im 4/46]
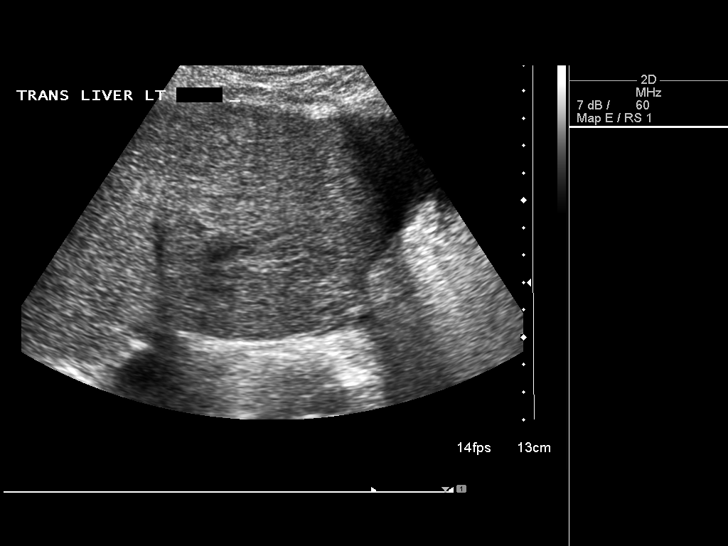
[im 8/46]
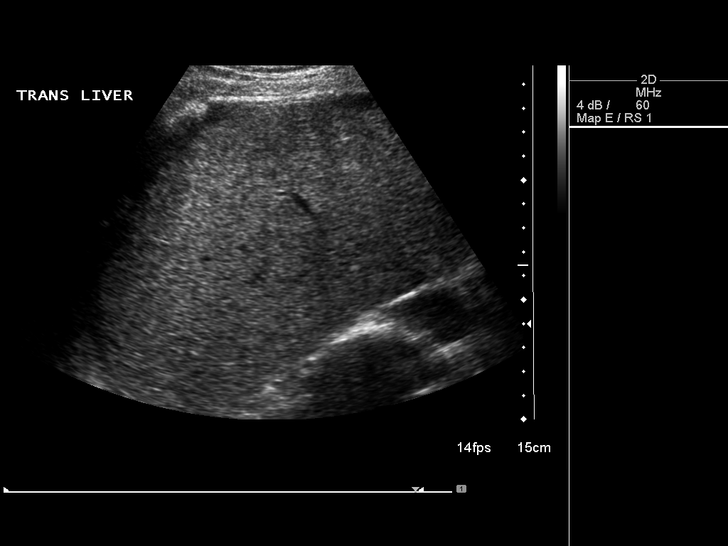
[im 12/46]
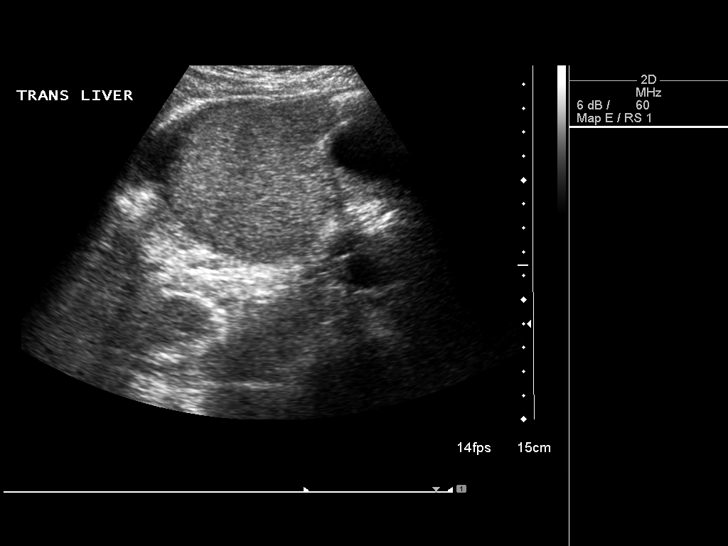
[im 16/46]
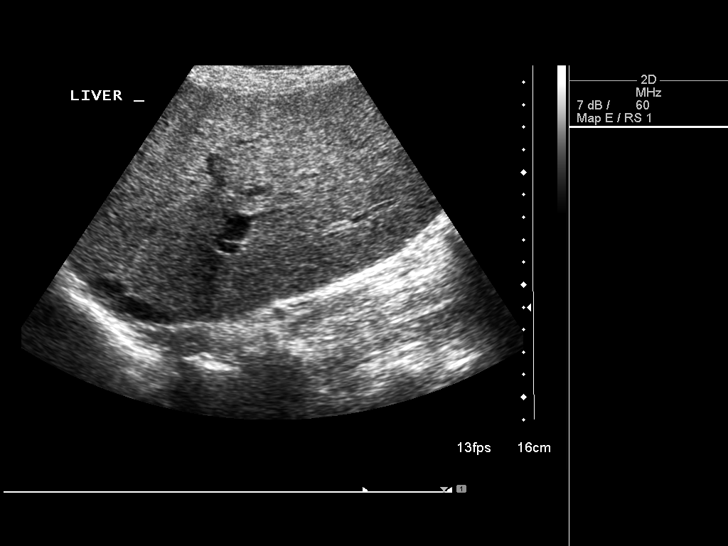
[im 17/46]
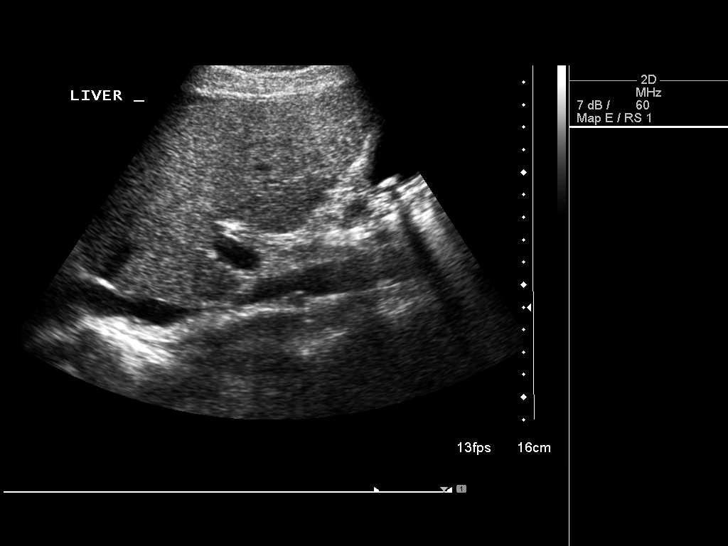
[im 21/46]
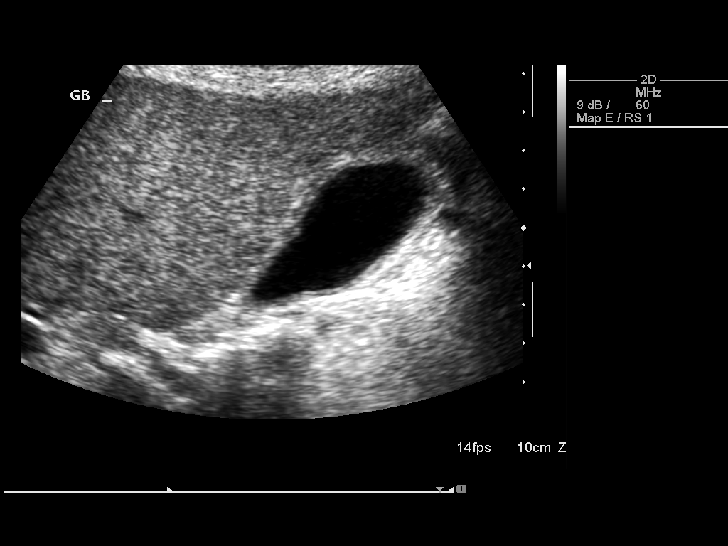
[im 25/46]
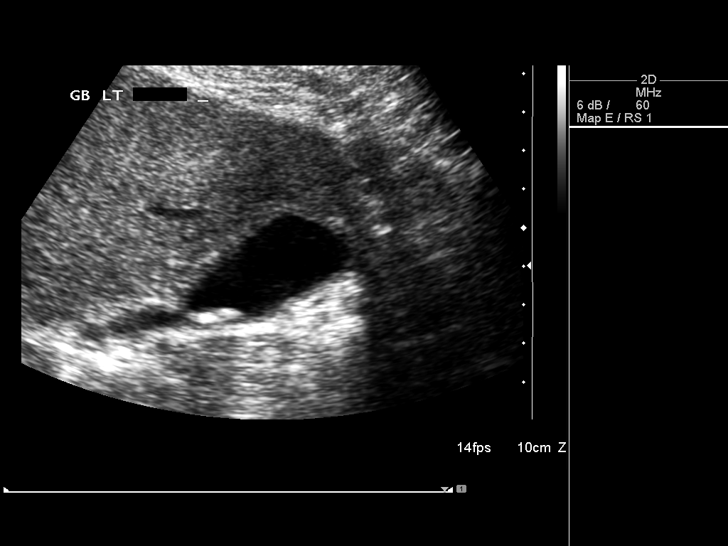
[im 29/46]
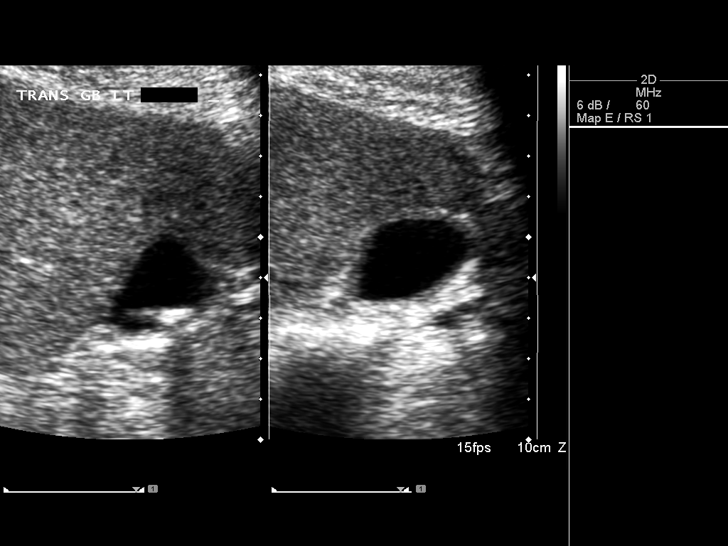
[im 31/46]
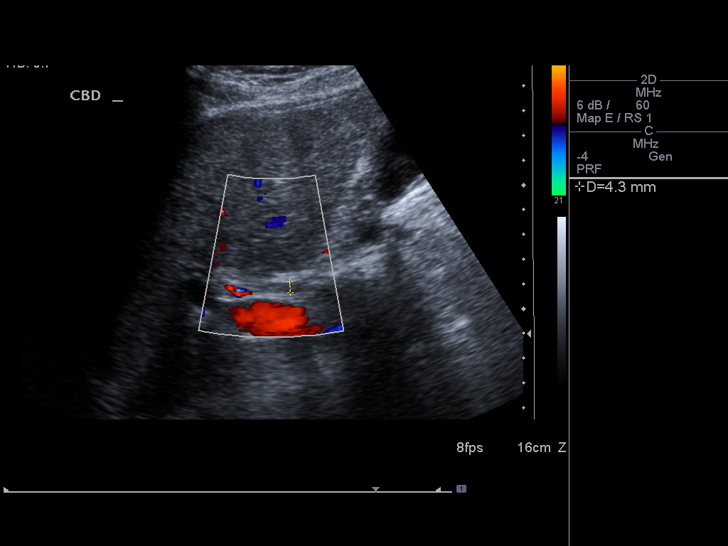
[im 34/46]
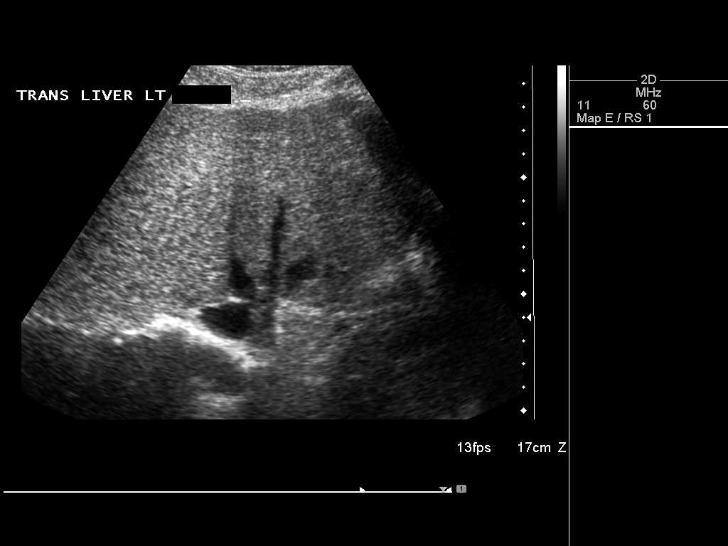
[im 38/46]
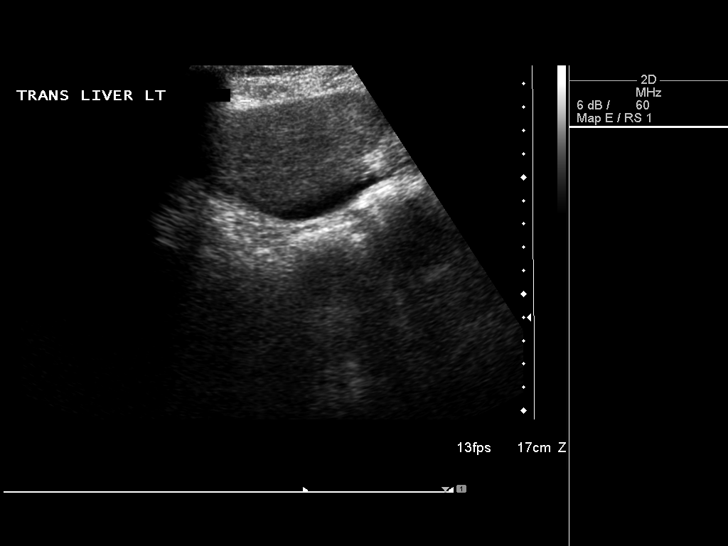
[im 42/46]
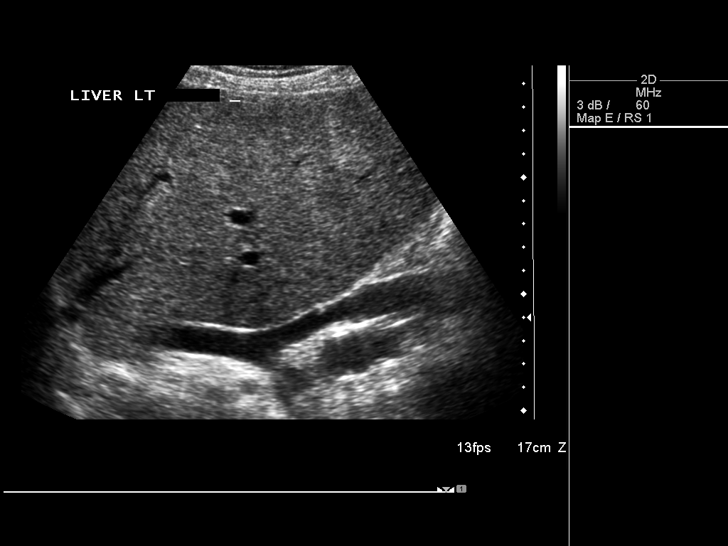
[im 46/46]
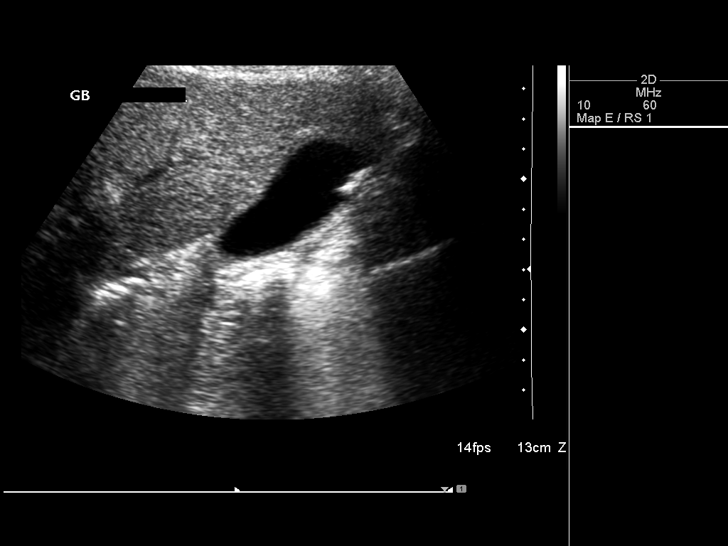

[14 of 25 positions shown; findings below may reference images not displayed]

FINDINGS: Gallbladder:

Cholelithiasis. At least 2 mobile shadowing stones. Normal wall
thickness with no sonographic Murphy's sign. Largest calculus less
than 7 mm.

Common bile duct:

Diameter: Normal measured 4.3 mm

Liver:

Coarse echotexture with lobular contours consistent with cirrhosis.
No focal masses seen.
IMPRESSION: Cholelithiasis.

Cirrhosis.

No mass or visible ascites.

## 2017-05-17 ENCOUNTER — Ambulatory Visit (INDEPENDENT_AMBULATORY_CARE_PROVIDER_SITE_OTHER): Payer: Self-pay | Admitting: Physician Assistant

## 2020-05-12 ENCOUNTER — Other Ambulatory Visit: Payer: Self-pay

## 2020-05-12 ENCOUNTER — Encounter (HOSPITAL_COMMUNITY): Payer: Self-pay

## 2020-05-12 ENCOUNTER — Inpatient Hospital Stay (HOSPITAL_COMMUNITY)
Admission: EM | Admit: 2020-05-12 | Discharge: 2020-05-16 | DRG: 177 | Disposition: A | Discharge status: Hospice | Payer: Medicaid Other | Attending: Student | Admitting: Student

## 2020-05-12 ENCOUNTER — Emergency Department (HOSPITAL_COMMUNITY): Payer: Medicaid Other

## 2020-05-12 DIAGNOSIS — Z781 Physical restraint status: Secondary | ICD-10-CM

## 2020-05-12 DIAGNOSIS — Z20822 Contact with and (suspected) exposure to covid-19: Secondary | ICD-10-CM | POA: Diagnosis present

## 2020-05-12 DIAGNOSIS — K7040 Alcoholic hepatic failure without coma: Secondary | ICD-10-CM | POA: Diagnosis present

## 2020-05-12 DIAGNOSIS — J690 Pneumonitis due to inhalation of food and vomit: Principal | ICD-10-CM | POA: Diagnosis present

## 2020-05-12 DIAGNOSIS — I248 Other forms of acute ischemic heart disease: Secondary | ICD-10-CM | POA: Diagnosis present

## 2020-05-12 DIAGNOSIS — J189 Pneumonia, unspecified organism: Secondary | ICD-10-CM

## 2020-05-12 DIAGNOSIS — Z515 Encounter for palliative care: Secondary | ICD-10-CM

## 2020-05-12 DIAGNOSIS — G9341 Metabolic encephalopathy: Secondary | ICD-10-CM | POA: Diagnosis present

## 2020-05-12 DIAGNOSIS — E43 Unspecified severe protein-calorie malnutrition: Secondary | ICD-10-CM | POA: Diagnosis present

## 2020-05-12 DIAGNOSIS — I350 Nonrheumatic aortic (valve) stenosis: Secondary | ICD-10-CM | POA: Diagnosis present

## 2020-05-12 DIAGNOSIS — F1020 Alcohol dependence, uncomplicated: Secondary | ICD-10-CM | POA: Diagnosis present

## 2020-05-12 DIAGNOSIS — R627 Adult failure to thrive: Secondary | ICD-10-CM | POA: Diagnosis present

## 2020-05-12 DIAGNOSIS — I9589 Other hypotension: Secondary | ICD-10-CM | POA: Diagnosis present

## 2020-05-12 DIAGNOSIS — J9601 Acute respiratory failure with hypoxia: Secondary | ICD-10-CM | POA: Diagnosis present

## 2020-05-12 DIAGNOSIS — R160 Hepatomegaly, not elsewhere classified: Secondary | ICD-10-CM

## 2020-05-12 DIAGNOSIS — E860 Dehydration: Secondary | ICD-10-CM | POA: Diagnosis present

## 2020-05-12 DIAGNOSIS — D689 Coagulation defect, unspecified: Secondary | ICD-10-CM | POA: Diagnosis present

## 2020-05-12 DIAGNOSIS — C228 Malignant neoplasm of liver, primary, unspecified as to type: Secondary | ICD-10-CM | POA: Diagnosis present

## 2020-05-12 DIAGNOSIS — Z681 Body mass index (BMI) 19 or less, adult: Secondary | ICD-10-CM

## 2020-05-12 DIAGNOSIS — R64 Cachexia: Secondary | ICD-10-CM | POA: Diagnosis present

## 2020-05-12 DIAGNOSIS — E878 Other disorders of electrolyte and fluid balance, not elsewhere classified: Secondary | ICD-10-CM | POA: Diagnosis present

## 2020-05-12 DIAGNOSIS — D6959 Other secondary thrombocytopenia: Secondary | ICD-10-CM | POA: Diagnosis present

## 2020-05-12 DIAGNOSIS — K068 Other specified disorders of gingiva and edentulous alveolar ridge: Secondary | ICD-10-CM | POA: Diagnosis present

## 2020-05-12 DIAGNOSIS — E872 Acidosis, unspecified: Secondary | ICD-10-CM

## 2020-05-12 DIAGNOSIS — R0600 Dyspnea, unspecified: Secondary | ICD-10-CM

## 2020-05-12 DIAGNOSIS — Z66 Do not resuscitate: Secondary | ICD-10-CM | POA: Diagnosis not present

## 2020-05-12 DIAGNOSIS — Z79899 Other long term (current) drug therapy: Secondary | ICD-10-CM

## 2020-05-12 DIAGNOSIS — N179 Acute kidney failure, unspecified: Secondary | ICD-10-CM | POA: Diagnosis present

## 2020-05-12 DIAGNOSIS — Z4659 Encounter for fitting and adjustment of other gastrointestinal appliance and device: Secondary | ICD-10-CM

## 2020-05-12 DIAGNOSIS — F17210 Nicotine dependence, cigarettes, uncomplicated: Secondary | ICD-10-CM | POA: Diagnosis present

## 2020-05-12 DIAGNOSIS — I10 Essential (primary) hypertension: Secondary | ICD-10-CM | POA: Diagnosis present

## 2020-05-12 DIAGNOSIS — K7011 Alcoholic hepatitis with ascites: Secondary | ICD-10-CM | POA: Diagnosis present

## 2020-05-12 DIAGNOSIS — K7031 Alcoholic cirrhosis of liver with ascites: Secondary | ICD-10-CM | POA: Diagnosis present

## 2020-05-12 DIAGNOSIS — I700 Atherosclerosis of aorta: Secondary | ICD-10-CM | POA: Diagnosis present

## 2020-05-12 DIAGNOSIS — G9340 Encephalopathy, unspecified: Secondary | ICD-10-CM | POA: Diagnosis present

## 2020-05-12 DIAGNOSIS — E876 Hypokalemia: Secondary | ICD-10-CM | POA: Diagnosis present

## 2020-05-12 DIAGNOSIS — I820 Budd-Chiari syndrome: Secondary | ICD-10-CM | POA: Diagnosis present

## 2020-05-12 MED ORDER — SODIUM CHLORIDE 0.9 % IV BOLUS
1000.0000 mL | Freq: Once | INTRAVENOUS | Status: AC
  Administered 2020-05-12: 1000 mL via INTRAVENOUS

## 2020-05-12 NOTE — ED Provider Notes (Signed)
[facility] DEPT Provider Note   CSN: [id number] Arrival date & time: 05/12/20  2304     History Chief Complaint  Patient presents with  . Fatigue  . Altered Mental Status    Shawn Nguyen is a 65 y.o. male.  Patient presents to the emergency department from home by ambulance with altered mental status.  Patient lives alone, according to EMS.  Neighbors had noticed that he had not been seen in approximately a week, noticed male piling up.  They checked on him tonight and found him lying half on, half off the couch in a disheveled manner.  His shirt was up over his head and he could not get up on his own.  Patient confused but does not have any current complaints. Level V Caveat due to altered mental status.        Past Medical History:  Diagnosis Date  . Alcoholism ([place])   . Ascites 03/2013    6 liter paracentesis 03/2013, 9 liter tap 04/2013, 5 liter tap 08/08/13. No SBP on 6/11 tap.  . Cirrhosis ([place]) 06/14/2013  . Esophageal varices ([place]) 12/25/2013  . Gallstones 03/2013   on ultrasound.   . Hepatitis C    tested + in 07/2013.   [facility] Nguyen Kitchen HTN (hypertension)   . Rectal varices - small 12/25/2013  . Umbilical hernia 03/1192   draining ascites    Patient Active Problem List   Diagnosis Date Noted  . Hepatic cirrhosis ([place])   . Erosive esophagitis 12/25/2013  . Esophageal varices ([place]) 12/25/2013  . Colon polyps 12/25/2013  . Rectal varices - small 12/25/2013  . Umbilical hernia without obstruction and without gangrene 11/22/2013  . Routine history and physical examination of adult 06/17/2013  . Ascites 06/14/2013  . Cirrhosis ([place]) 06/14/2013  . 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 HTN (hypertension) 06/14/2013  . Alcohol abuse 06/14/2013    Past Surgical History:  Procedure Laterality Date  . COLONOSCOPY N/A 12/25/2013   Procedure: COLONOSCOPY;  Surgeon: Gatha Mayer, MD;  Location: [facility];  Service: Endoscopy;   Laterality: N/A;  . ESOPHAGOGASTRODUODENOSCOPY N/A 12/25/2013   Procedure: ESOPHAGOGASTRODUODENOSCOPY (EGD);  Surgeon: Gatha Mayer, MD;  Location: [facility];  Service: Endoscopy;  Laterality: N/A;  . ESOPHAGOGASTRODUODENOSCOPY (EGD) WITH PROPOFOL N/A 03/16/2015   Procedure: ESOPHAGOGASTRODUODENOSCOPY (EGD) WITH PROPOFOL;  Surgeon: Gatha Mayer, MD;  Location: [facility];  Service: Endoscopy;  Laterality: N/A;       Family History  Problem Relation Age of Onset  . Diabetes Father     Social History   Tobacco Use  . Smoking status: Current Some Day Smoker    Packs/day: 0.25    Types: Cigarettes  . Smokeless tobacco: Never Used  . Tobacco comment: Smoking 1-2 every other day  Substance Use Topics  . Alcohol use: Yes    Alcohol/week: 0.0 standard drinks    Comment: occasional  . Drug use: No    Comment: former 2005    Home Medications Prior to Admission medications   Medication Sig Start Date End Date Taking? Authorizing Provider  furosemide (LASIX) 40 MG tablet Take 1 tablet (40 mg total) by mouth daily. Patient not taking: Reported on 05/13/2020 01/27/15   Gatha Mayer, MD  omeprazole (PRILOSEC) 20 MG capsule Take 1 capsule (20 mg total) by mouth daily before breakfast. Patient not taking: Reported on 05/13/2020 01/27/15   Gatha Mayer, MD  ribavirin (REBETOL) 200 MG capsule Take 3 capsules (600  mg total) by mouth 2 (two) times daily. Patient not taking: Reported on 05/13/2020 06/30/15   Thayer Headings, MD  Sofosbuvir-Velpatasvir (EPCLUSA) 400-100 MG TABS Take 1 tablet by mouth daily. Patient not taking: Reported on 05/13/2020 06/30/15   Thayer Headings, MD  spironolactone (ALDACTONE) 25 MG tablet Take 2 tablets (50 mg total) by mouth daily. Patient not taking: Reported on 05/13/2020 01/27/15   Gatha Mayer, MD  potassium chloride SA (K-DUR,KLOR-CON) 20 MEQ tablet Take 1 tablet (20 mEq total) by mouth daily. 06/14/13 10/02/13  Lorayne Marek, MD    Allergies     Patient has no known allergies.  Review of Systems   Review of Systems  Unable to perform ROS: Mental status change    Physical Exam Updated Vital Signs BP 124/78   Pulse 76   Temp (!) 97.2 F (36.2 C) (Rectal)   Resp 17   Ht 6' (1.829 m)   Wt 54.4 kg   SpO2 98%   BMI 16.27 kg/m   Physical Exam Vitals and nursing note reviewed.  Constitutional:      General: He is not in acute distress.    Appearance: Normal appearance. He is well-developed.  HENT:     Head: Normocephalic and atraumatic.     Right Ear: Hearing normal.     Left Ear: Hearing normal.     Nose: Nose normal.     Mouth/Throat:     Mouth: Mucous membranes are dry.     Comments: Severely dry, cracked lips and oral mucosa Eyes:     Conjunctiva/sclera: Conjunctivae normal.     Pupils: Pupils are equal, round, and reactive to light.  Cardiovascular:     Rate and Rhythm: Regular rhythm.     Heart sounds: S1 normal and S2 normal. No murmur heard. No friction rub. No gallop.   Pulmonary:     Effort: Pulmonary effort is normal. No respiratory distress.     Breath sounds: Normal breath sounds.  Chest:     Chest wall: No tenderness.  Abdominal:     General: Bowel sounds are normal.     Palpations: Abdomen is soft.     Tenderness: There is no abdominal tenderness. There is no guarding or rebound. Negative signs include Murphy's sign and McBurney's sign.     Hernia: No hernia is present.  Musculoskeletal:        General: Normal range of motion.     Cervical back: Normal range of motion and neck supple.  Skin:    General: Skin is warm and dry.     Findings: No rash.  Neurological:     Mental Status: He is alert. He is confused.     GCS: GCS eye subscore is 4. GCS verbal subscore is 4. GCS motor subscore is 6.     Cranial Nerves: No cranial nerve deficit.     Sensory: No sensory deficit.     Coordination: Coordination normal.  Psychiatric:        Speech: Speech normal.        Behavior: Behavior normal.         Thought Content: Thought content normal.     ED Results / Procedures / Treatments   Labs (all labs ordered are listed, but only abnormal results are displayed) Labs Reviewed  CBC WITH DIFFERENTIAL/PLATELET - Abnormal; Notable for the following components:      Result Value   RDW 15.7 (*)    Platelets 25 (*)    nRBC  0.8 (*)    All other components within normal limits  BASIC METABOLIC PANEL - Abnormal; Notable for the following components:   Potassium 3.1 (*)    CO2 19 (*)    Glucose, Bld 122 (*)    BUN 61 (*)    Creatinine, Ser 1.26 (*)    Calcium 8.1 (*)    All other components within normal limits  HEPATIC FUNCTION PANEL - Abnormal; Notable for the following components:   Albumin 2.3 (*)    AST 62 (*)    Total Bilirubin 4.8 (*)    Bilirubin, Direct 2.5 (*)    Indirect Bilirubin 2.3 (*)    All other components within normal limits  LACTIC ACID, PLASMA - Abnormal; Notable for the following components:   Lactic Acid, Venous 4.8 (*)    All other components within normal limits  URINALYSIS, ROUTINE W REFLEX MICROSCOPIC - Abnormal; Notable for the following components:   Color, Urine AMBER (*)    Hgb urine dipstick MODERATE (*)    Protein, ur 30 (*)    All other components within normal limits  RAPID URINE DRUG SCREEN, HOSP PERFORMED - Abnormal; Notable for the following components:   Tetrahydrocannabinol POSITIVE (*)    All other components within normal limits  PROTIME-INR - Abnormal; Notable for the following components:   Prothrombin Time 17.9 (*)    INR 1.5 (*)    All other components within normal limits  LACTIC ACID, PLASMA - Abnormal; Notable for the following components:   Lactic Acid, Venous 5.1 (*)    All other components within normal limits  TROPONIN I (HIGH SENSITIVITY) - Abnormal; Notable for the following components:   Troponin I (High Sensitivity) 39 (*)    All other components within normal limits  TROPONIN I (HIGH SENSITIVITY) - Abnormal; Notable  for the following components:   Troponin I (High Sensitivity) 47 (*)    All other components within normal limits  RESP PANEL BY RT-PCR (FLU A&B, COVID) ARPGX2  URINE CULTURE  CULTURE, BLOOD (ROUTINE X 2)  CULTURE, BLOOD (ROUTINE X 2)  ETHANOL  BETA-HYDROXYBUTYRIC ACID  AMMONIA  CK    EKG EKG Interpretation  Date/Time:  Wednesday May 13 2020 01:13:05 EDT Ventricular Rate:  79 PR Interval:    QRS Duration: 109 QT Interval:  444 QTC Calculation: 509 R Axis:   91 Text Interpretation: Sinus rhythm Ventricular premature complex Anterior infarct, age indeterminate Prolonged QT interval No previous tracing Confirmed by Orpah Greek [phone]) on 05/13/2020 1:15:43 AM   Radiology DG Chest 1 View  Result Date: 05/12/2020 CLINICAL DATA:  Fall.  Lethargy. EXAM: CHEST  1 VIEW COMPARISON:  None. FINDINGS: Lungs are hyperinflated. Normal heart size and mediastinal contours. No pneumothorax or pleural effusion. A skin fold projects over the right hemithorax on image 2 of 2. No focal airspace disease. No pulmonary edema. No acute osseous abnormalities are seen IMPRESSION: Hyperinflation without acute abnormality. Electronically Signed   By: Keith Rake M.D.   On: 05/12/2020 23:39   CT HEAD WO CONTRAST  Result Date: 05/13/2020 CLINICAL DATA:  Intoxicated, altered mental status. EXAM: CT HEAD WITHOUT CONTRAST CT CERVICAL SPINE WITHOUT CONTRAST TECHNIQUE: Multidetector CT imaging of the head and cervical spine was performed following the standard protocol without intravenous contrast. Multiplanar CT image reconstructions of the cervical spine were also generated. COMPARISON:  None. FINDINGS: CT HEAD FINDINGS Brain: No evidence of large-territorial acute infarction. No parenchymal hemorrhage. No mass lesion. No extra-axial collection. No mass  effect or midline shift. No hydrocephalus. Basilar cisterns are patent. Vascular: No hyperdense vessel. Skull: No acute fracture or focal lesion.  Sinuses/Orbits: Mucosal thickening of the left maxillary sinus. Under pneumatization of the frontal sinuses. Otherwise the paranasal sinuses and mastoid air cells are clear. The orbits are unremarkable. Other: None. CT CERVICAL SPINE FINDINGS Alignment: For reversal of the normal cervical lordosis centered at the C3 level likely due to degenerative changes and positioning. Skull base and vertebrae: C3-C4 intervertebral disc space narrowing, osteophyte formation. C1-C2 degenerative changes. No acute fracture. No aggressive appearing focal osseous lesion or focal pathologic process. Soft tissues and spinal canal: No prevertebral fluid or swelling. No visible canal hematoma. Upper chest: Trace biapical pleural/pulmonary scarring. Other: None. IMPRESSION: 1. No acute intracranial abnormality. 2. No acute displaced fracture or traumatic listhesis of the cervical spine. Electronically Signed   By: Iven Finn M.D.   On: 05/13/2020 00:58   CT CERVICAL SPINE WO CONTRAST  Result Date: 05/13/2020 CLINICAL DATA:  Intoxicated, altered mental status. EXAM: CT HEAD WITHOUT CONTRAST CT CERVICAL SPINE WITHOUT CONTRAST TECHNIQUE: Multidetector CT imaging of the head and cervical spine was performed following the standard protocol without intravenous contrast. Multiplanar CT image reconstructions of the cervical spine were also generated. COMPARISON:  None. FINDINGS: CT HEAD FINDINGS Brain: No evidence of large-territorial acute infarction. No parenchymal hemorrhage. No mass lesion. No extra-axial collection. No mass effect or midline shift. No hydrocephalus. Basilar cisterns are patent. Vascular: No hyperdense vessel. Skull: No acute fracture or focal lesion. Sinuses/Orbits: Mucosal thickening of the left maxillary sinus. Under pneumatization of the frontal sinuses. Otherwise the paranasal sinuses and mastoid air cells are clear. The orbits are unremarkable. Other: None. CT CERVICAL SPINE FINDINGS Alignment: For reversal of  the normal cervical lordosis centered at the C3 level likely due to degenerative changes and positioning. Skull base and vertebrae: C3-C4 intervertebral disc space narrowing, osteophyte formation. C1-C2 degenerative changes. No acute fracture. No aggressive appearing focal osseous lesion or focal pathologic process. Soft tissues and spinal canal: No prevertebral fluid or swelling. No visible canal hematoma. Upper chest: Trace biapical pleural/pulmonary scarring. Other: None. IMPRESSION: 1. No acute intracranial abnormality. 2. No acute displaced fracture or traumatic listhesis of the cervical spine. Electronically Signed   By: Iven Finn M.D.   On: 05/13/2020 00:58    Procedures Procedures   Medications Ordered in ED Medications  LORazepam (ATIVAN) injection 0-4 mg (0 mg Intravenous Not Given 05/13/20 0141)    Or  LORazepam (ATIVAN) tablet 0-4 mg ( Oral See Alternative 05/13/20 0141)  LORazepam (ATIVAN) injection 0-4 mg (has no administration in time range)    Or  LORazepam (ATIVAN) tablet 0-4 mg (has no administration in time range)  thiamine tablet 100 mg (has no administration in time range)    Or  thiamine (B-1) injection 100 mg (has no administration in time range)  lactated ringers infusion (has no administration in time range)  ceFEPIme (MAXIPIME) 2 g in sodium chloride 0.9 % 100 mL IVPB (has no administration in time range)  metroNIDAZOLE (FLAGYL) IVPB 500 mg (has no administration in time range)  vancomycin (VANCOCIN) IVPB 1000 mg/200 mL premix (has no administration in time range)  lactated ringers bolus 1,000 mL (has no administration in time range)  sodium chloride 0.9 % bolus 1,000 mL (0 mLs Intravenous Stopped 05/13/20 0112)  sodium chloride 0.9 % bolus 1,000 mL (1,000 mLs Intravenous New Bag/Given 05/13/20 0405)    ED Course  I have reviewed the triage  vital signs and the nursing notes.  Pertinent labs & imaging results that were available during my care of the patient  were reviewed by me and considered in my medical decision making (see chart for details).    MDM Rules/Calculators/A&P                          Patient presents to the emergency department for evaluation after being found during a wellness check on the ground.  Patient is not quite clear what has happened with him over the last week.  Neighbors noticed that his mail was piling up and checked on him.  He was found laying half on, half off the couch unable to get up.  Patient appears very dehydrated.  He denies chest pain, fever and recent illness.  He feels very thirsty at arrival.  He was given IV fluid bolus.  Patient is a known cirrhotic.  CBC shows normal white blood cell count.  He has a new thrombocytopenia, likely secondary to his cirrhosis.  INR is likewise elevated.  Chemistries reveal elevation of BUN but not a significant elevation of his creatinine.  Electrolytes are unremarkable.  Lactic acid is elevated.  Significance of this was unclear at arrival based on his liver disease.  Chest x-ray without pneumonia.  Urinalysis does not suggest infection.  A CT of his head and cervical spine were performed because of possible fall.  No acute abnormalities are noted.  Lactic acid was rechecked after hydration and it has actually gone up.  We will therefore treat as possible sepsis with broad-spectrum antibiotics, cultures.  Patient's troponins are mildly elevated as well.  Second troponin not significantly elevated above the first.  EKG does not suggest ischemia or infarct.  Total CPK is not elevated, no evidence of rhabdomyolysis from lying on the ground.  We will admit to hospital for further monitoring and evaluation.  Final Clinical Impression(s) / ED Diagnoses Final diagnoses:  Lactic acidosis    Rx / DC Orders ED Discharge Orders    None       Alya Smaltz, Gwenyth Allegra, MD 05/13/20 [phone]

## 2020-05-12 NOTE — ED Triage Notes (Signed)
Arrived with EMS from home. Pt neighbor called EMS due to concern with not seeing patient for a week. EMS arrived and pt was laying on couch, lethargic. EMS reports AMS. Pt is lethargic on arrival, but responds to questions

## 2020-05-13 ENCOUNTER — Emergency Department (HOSPITAL_COMMUNITY): Payer: Medicaid Other

## 2020-05-13 ENCOUNTER — Inpatient Hospital Stay (HOSPITAL_COMMUNITY): Payer: Medicaid Other

## 2020-05-13 ENCOUNTER — Encounter (HOSPITAL_COMMUNITY): Payer: Self-pay | Admitting: Internal Medicine

## 2020-05-13 DIAGNOSIS — I10 Essential (primary) hypertension: Secondary | ICD-10-CM | POA: Diagnosis present

## 2020-05-13 DIAGNOSIS — E43 Unspecified severe protein-calorie malnutrition: Secondary | ICD-10-CM | POA: Diagnosis not present

## 2020-05-13 DIAGNOSIS — G9341 Metabolic encephalopathy: Secondary | ICD-10-CM | POA: Diagnosis present

## 2020-05-13 DIAGNOSIS — K7031 Alcoholic cirrhosis of liver with ascites: Secondary | ICD-10-CM | POA: Diagnosis present

## 2020-05-13 DIAGNOSIS — G9340 Encephalopathy, unspecified: Secondary | ICD-10-CM | POA: Diagnosis not present

## 2020-05-13 DIAGNOSIS — D696 Thrombocytopenia, unspecified: Secondary | ICD-10-CM | POA: Diagnosis not present

## 2020-05-13 DIAGNOSIS — J9601 Acute respiratory failure with hypoxia: Secondary | ICD-10-CM | POA: Diagnosis present

## 2020-05-13 DIAGNOSIS — D689 Coagulation defect, unspecified: Secondary | ICD-10-CM | POA: Diagnosis present

## 2020-05-13 DIAGNOSIS — R64 Cachexia: Secondary | ICD-10-CM | POA: Diagnosis present

## 2020-05-13 DIAGNOSIS — D6959 Other secondary thrombocytopenia: Secondary | ICD-10-CM | POA: Diagnosis present

## 2020-05-13 DIAGNOSIS — E872 Acidosis: Secondary | ICD-10-CM | POA: Diagnosis present

## 2020-05-13 DIAGNOSIS — Z66 Do not resuscitate: Secondary | ICD-10-CM | POA: Diagnosis not present

## 2020-05-13 DIAGNOSIS — I820 Budd-Chiari syndrome: Secondary | ICD-10-CM | POA: Diagnosis present

## 2020-05-13 DIAGNOSIS — Z20822 Contact with and (suspected) exposure to covid-19: Secondary | ICD-10-CM | POA: Diagnosis present

## 2020-05-13 DIAGNOSIS — N179 Acute kidney failure, unspecified: Secondary | ICD-10-CM

## 2020-05-13 DIAGNOSIS — E876 Hypokalemia: Secondary | ICD-10-CM | POA: Diagnosis present

## 2020-05-13 DIAGNOSIS — C228 Malignant neoplasm of liver, primary, unspecified as to type: Secondary | ICD-10-CM | POA: Diagnosis present

## 2020-05-13 DIAGNOSIS — Z681 Body mass index (BMI) 19 or less, adult: Secondary | ICD-10-CM | POA: Diagnosis not present

## 2020-05-13 DIAGNOSIS — Z79899 Other long term (current) drug therapy: Secondary | ICD-10-CM | POA: Diagnosis not present

## 2020-05-13 DIAGNOSIS — I9589 Other hypotension: Secondary | ICD-10-CM | POA: Diagnosis not present

## 2020-05-13 DIAGNOSIS — J690 Pneumonitis due to inhalation of food and vomit: Secondary | ICD-10-CM | POA: Diagnosis present

## 2020-05-13 DIAGNOSIS — K068 Other specified disorders of gingiva and edentulous alveolar ridge: Secondary | ICD-10-CM | POA: Diagnosis present

## 2020-05-13 DIAGNOSIS — I248 Other forms of acute ischemic heart disease: Secondary | ICD-10-CM | POA: Diagnosis present

## 2020-05-13 DIAGNOSIS — Z515 Encounter for palliative care: Secondary | ICD-10-CM | POA: Diagnosis not present

## 2020-05-13 DIAGNOSIS — R160 Hepatomegaly, not elsewhere classified: Secondary | ICD-10-CM | POA: Diagnosis not present

## 2020-05-13 DIAGNOSIS — F1020 Alcohol dependence, uncomplicated: Secondary | ICD-10-CM | POA: Diagnosis present

## 2020-05-13 DIAGNOSIS — F17210 Nicotine dependence, cigarettes, uncomplicated: Secondary | ICD-10-CM | POA: Diagnosis present

## 2020-05-13 DIAGNOSIS — K7011 Alcoholic hepatitis with ascites: Secondary | ICD-10-CM | POA: Diagnosis present

## 2020-05-13 LAB — LACTIC ACID, PLASMA
Lactic Acid, Venous: 4.8 mmol/L (ref 0.5–1.9)
Lactic Acid, Venous: 5.1 mmol/L (ref 0.5–1.9)
Lactic Acid, Venous: 4.5 mmol/L (ref 0.5–1.9)
Lactic Acid, Venous: 7.2 mmol/L (ref 0.5–1.9)

## 2020-05-13 LAB — COMPREHENSIVE METABOLIC PANEL
ALT: 36 U/L (ref 0–44)
AST: 55 U/L — ABNORMAL HIGH (ref 15–41)
Albumin: 2 g/dL — ABNORMAL LOW (ref 3.5–5.0)
Alkaline Phosphatase: 103 U/L (ref 38–126)
Anion gap: 13 (ref 5–15)
BUN: 39 mg/dL — ABNORMAL HIGH (ref 8–23)
CO2: 19 mmol/L — ABNORMAL LOW (ref 22–32)
Calcium: 7.8 mg/dL — ABNORMAL LOW (ref 8.9–10.3)
Chloride: 109 mmol/L (ref 98–111)
Creatinine, Ser: 0.85 mg/dL (ref 0.61–1.24)
GFR, Estimated: >60 mL/min (ref >60)
Glucose, Bld: 92 mg/dL (ref 70–99)
Potassium: 3.1 mmol/L — ABNORMAL LOW (ref 3.5–5.1)
Sodium: 141 mmol/L (ref 135–145)
Total Bilirubin: 4.9 mg/dL — ABNORMAL HIGH (ref 0.3–1.2)
Total Protein: 5.9 g/dL — ABNORMAL LOW (ref 6.5–8.1)

## 2020-05-13 LAB — URINALYSIS, ROUTINE W REFLEX MICROSCOPIC
Bacteria, UA: NONE SEEN
Bilirubin Urine: NEGATIVE
Glucose, UA: NEGATIVE mg/dL
Ketones, ur: NEGATIVE mg/dL
Leukocytes,Ua: NEGATIVE
Nitrite: NEGATIVE
Protein, ur: 30 mg/dL — AB
Specific Gravity, Urine: 1.02 (ref 1.005–1.030)
pH: 5 (ref 5.0–8.0)

## 2020-05-13 LAB — CBC WITH DIFFERENTIAL/PLATELET
Abs Immature Granulocytes: 0.04 10*3/uL (ref 0.00–0.07)
Basophils Absolute: 0 10*3/uL (ref 0.0–0.1)
Basophils Relative: 0 %
Eosinophils Absolute: 0 10*3/uL (ref 0.0–0.5)
Eosinophils Relative: 0 %
HCT: 39.9 % (ref 39.0–52.0)
Hemoglobin: 13.8 g/dL (ref 13.0–17.0)
Immature Granulocytes: 1 %
Lymphocytes Relative: 17 %
Lymphs Abs: 1.2 10*3/uL (ref 0.7–4.0)
MCH: 28.9 pg (ref 26.0–34.0)
MCHC: 34.6 g/dL (ref 30.0–36.0)
MCV: 83.5 fL (ref 80.0–100.0)
Monocytes Absolute: 0.6 10*3/uL (ref 0.1–1.0)
Monocytes Relative: 9 %
Neutro Abs: 4.8 10*3/uL (ref 1.7–7.7)
Neutrophils Relative %: 73 %
Platelets: 25 10*3/uL — CL (ref 150–400)
RBC: 4.78 MIL/uL (ref 4.22–5.81)
RDW: 15.7 % — ABNORMAL HIGH (ref 11.5–15.5)
WBC: 6.7 10*3/uL (ref 4.0–10.5)
nRBC: 0.8 % — ABNORMAL HIGH (ref 0.0–0.2)

## 2020-05-13 LAB — HEPATIC FUNCTION PANEL
ALT: 44 U/L (ref 0–44)
AST: 62 U/L — ABNORMAL HIGH (ref 15–41)
Albumin: 2.3 g/dL — ABNORMAL LOW (ref 3.5–5.0)
Alkaline Phosphatase: 123 U/L (ref 38–126)
Bilirubin, Direct: 2.5 mg/dL — ABNORMAL HIGH (ref 0.0–0.2)
Indirect Bilirubin: 2.3 mg/dL — ABNORMAL HIGH (ref 0.3–0.9)
Total Bilirubin: 4.8 mg/dL — ABNORMAL HIGH (ref 0.3–1.2)
Total Protein: 6.9 g/dL (ref 6.5–8.1)

## 2020-05-13 LAB — RESP PANEL BY RT-PCR (FLU A&B, COVID) ARPGX2
Influenza A by PCR: NEGATIVE
Influenza B by PCR: NEGATIVE
SARS Coronavirus 2 by RT PCR: NEGATIVE

## 2020-05-13 LAB — TYPE AND SCREEN
ABO/RH(D): O POS
Antibody Screen: NEGATIVE

## 2020-05-13 LAB — RAPID URINE DRUG SCREEN, HOSP PERFORMED
Amphetamines: NOT DETECTED
Barbiturates: NOT DETECTED
Benzodiazepines: NOT DETECTED
Cocaine: NOT DETECTED
Opiates: NOT DETECTED
Tetrahydrocannabinol: POSITIVE — AB

## 2020-05-13 LAB — MRSA PCR SCREENING: MRSA by PCR: NEGATIVE

## 2020-05-13 LAB — BASIC METABOLIC PANEL
Anion gap: 15 (ref 5–15)
BUN: 61 mg/dL — ABNORMAL HIGH (ref 8–23)
CO2: 19 mmol/L — ABNORMAL LOW (ref 22–32)
Calcium: 8.1 mg/dL — ABNORMAL LOW (ref 8.9–10.3)
Chloride: 105 mmol/L (ref 98–111)
Creatinine, Ser: 1.26 mg/dL — ABNORMAL HIGH (ref 0.61–1.24)
GFR, Estimated: >60 mL/min (ref >60)
Glucose, Bld: 122 mg/dL — ABNORMAL HIGH (ref 70–99)
Potassium: 3.1 mmol/L — ABNORMAL LOW (ref 3.5–5.1)
Sodium: 139 mmol/L (ref 135–145)

## 2020-05-13 LAB — BETA-HYDROXYBUTYRIC ACID: Beta-Hydroxybutyric Acid: 0.17 mmol/L (ref 0.05–0.27)

## 2020-05-13 LAB — ABO/RH: ABO/RH(D): O POS

## 2020-05-13 LAB — PROTIME-INR
INR: 1.5 — ABNORMAL HIGH (ref 0.8–1.2)
Prothrombin Time: 17.9 seconds — ABNORMAL HIGH (ref 11.4–15.2)

## 2020-05-13 LAB — FOLATE: Folate: 3.7 ng/mL — ABNORMAL LOW (ref >5.9)

## 2020-05-13 LAB — AMMONIA: Ammonia: 20 umol/L (ref 9–35)

## 2020-05-13 LAB — PROCALCITONIN: Procalcitonin: 4.88 ng/mL

## 2020-05-13 LAB — HIV ANTIBODY (ROUTINE TESTING W REFLEX): HIV Screen 4th Generation wRfx: NONREACTIVE

## 2020-05-13 LAB — ETHANOL: Alcohol, Ethyl (B): <10 mg/dL (ref <10)

## 2020-05-13 LAB — TROPONIN I (HIGH SENSITIVITY)
Troponin I (High Sensitivity): 39 ng/L — ABNORMAL HIGH (ref <18)
Troponin I (High Sensitivity): 47 ng/L — ABNORMAL HIGH

## 2020-05-13 LAB — CK: Total CK: 54 U/L (ref 49–397)

## 2020-05-13 LAB — VITAMIN B12: Vitamin B-12: 4314 pg/mL — ABNORMAL HIGH (ref 180–914)

## 2020-05-13 LAB — MAGNESIUM: Magnesium: 2.4 mg/dL (ref 1.7–2.4)

## 2020-05-13 MED ORDER — THIAMINE HCL 100 MG PO TABS: 100.0000 mg | ORAL_TABLET | Freq: Every day | ORAL | Status: DC

## 2020-05-13 MED ORDER — IOHEXOL 300 MG/ML  SOLN
100.0000 mL | Freq: Once | INTRAMUSCULAR | Status: AC | PRN
  Administered 2020-05-13: 100 mL via INTRAVENOUS

## 2020-05-13 MED ORDER — ORAL CARE MOUTH RINSE
15.0000 mL | Freq: Two times a day (BID) | OROMUCOSAL | Status: DC
  Administered 2020-05-13 – 2020-05-14 (×3): 15 mL via OROMUCOSAL

## 2020-05-13 MED ORDER — LORAZEPAM 1 MG PO TABS: 0.0000 mg | ORAL_TABLET | Freq: Two times a day (BID) | ORAL | Status: DC

## 2020-05-13 MED ORDER — POTASSIUM CHLORIDE 10 MEQ/100ML IV SOLN
10.0000 meq | INTRAVENOUS | Status: AC
Start: 2020-05-13 — End: 2020-05-14
  Administered 2020-05-13 – 2020-05-14 (×4): 10 meq via INTRAVENOUS
  Filled 2020-05-13 (×3): qty 100

## 2020-05-13 MED ORDER — VANCOMYCIN HCL IN DEXTROSE 1-5 GM/200ML-% IV SOLN
1000.0000 mg | Freq: Once | INTRAVENOUS | Status: AC
  Administered 2020-05-13: 1000 mg via INTRAVENOUS
  Filled 2020-05-13: qty 200

## 2020-05-13 MED ORDER — METRONIDAZOLE IN NACL 5-0.79 MG/ML-% IV SOLN
500.0000 mg | Freq: Three times a day (TID) | INTRAVENOUS | Status: DC
  Administered 2020-05-13 – 2020-05-15 (×6): 500 mg via INTRAVENOUS
  Filled 2020-05-13 (×6): qty 100

## 2020-05-13 MED ORDER — LORAZEPAM 2 MG/ML IJ SOLN: 0.0000 mg | Freq: Two times a day (BID) | INTRAMUSCULAR | Status: DC

## 2020-05-13 MED ORDER — LORAZEPAM 2 MG/ML IJ SOLN
0.0000 mg | Freq: Four times a day (QID) | INTRAMUSCULAR | Status: DC
  Administered 2020-05-13 – 2020-05-14 (×2): 1 mg via INTRAVENOUS
  Filled 2020-05-13 (×2): qty 1

## 2020-05-13 MED ORDER — SODIUM CHLORIDE 0.9% IV SOLUTION: Freq: Once | INTRAVENOUS | Status: AC

## 2020-05-13 MED ORDER — THIAMINE HCL 100 MG/ML IJ SOLN
Freq: Once | INTRAVENOUS | Status: AC
  Filled 2020-05-13: qty 1000

## 2020-05-13 MED ORDER — LORAZEPAM 1 MG PO TABS: 0.0000 mg | ORAL_TABLET | Freq: Four times a day (QID) | ORAL | Status: DC

## 2020-05-13 MED ORDER — SODIUM CHLORIDE 0.9 % IV SOLN
2.0000 g | Freq: Two times a day (BID) | INTRAVENOUS | Status: DC
  Administered 2020-05-13 – 2020-05-14 (×2): 2 g via INTRAVENOUS
  Filled 2020-05-13 (×2): qty 2

## 2020-05-13 MED ORDER — METRONIDAZOLE IN NACL 5-0.79 MG/ML-% IV SOLN
500.0000 mg | Freq: Once | INTRAVENOUS | Status: AC
  Administered 2020-05-13: 500 mg via INTRAVENOUS
  Filled 2020-05-13: qty 100

## 2020-05-13 MED ORDER — CHLORHEXIDINE GLUCONATE CLOTH 2 % EX PADS
6.0000 | MEDICATED_PAD | Freq: Every day | CUTANEOUS | Status: DC
  Administered 2020-05-13 – 2020-05-14 (×2): 6 via TOPICAL

## 2020-05-13 MED ORDER — SODIUM CHLORIDE 0.9 % IV BOLUS
1000.0000 mL | Freq: Once | INTRAVENOUS | Status: AC
  Administered 2020-05-13: 1000 mL via INTRAVENOUS

## 2020-05-13 MED ORDER — SODIUM CHLORIDE 0.9 % IV SOLN
2.0000 g | Freq: Once | INTRAVENOUS | Status: AC
  Administered 2020-05-13: 2 g via INTRAVENOUS
  Filled 2020-05-13: qty 2

## 2020-05-13 MED ORDER — LACTATED RINGERS IV BOLUS (SEPSIS)
1000.0000 mL | Freq: Once | INTRAVENOUS | Status: AC
  Administered 2020-05-13: 1000 mL via INTRAVENOUS

## 2020-05-13 MED ORDER — LACTATED RINGERS IV SOLN: INTRAVENOUS | Status: AC

## 2020-05-13 MED ORDER — THIAMINE HCL 100 MG/ML IJ SOLN
100.0000 mg | Freq: Every day | INTRAMUSCULAR | Status: DC
  Administered 2020-05-13 – 2020-05-14 (×2): 100 mg via INTRAVENOUS
  Filled 2020-05-13 (×2): qty 2

## 2020-05-13 NOTE — Progress Notes (Signed)
A consult was received from an ED physician for Vancomycin & Cefepime per pharmacy dosing.  The patient's profile has been reviewed for ht/wt/allergies/indication/available labs.   A one time order has been placed for Cefepime 2gm & Vancomycin 1gm IV.  Further antibiotics/pharmacy consults should be ordered by admitting physician if indicated.                       Thank you, Netta Cedars PharmD 05/13/2020  5:26 AM

## 2020-05-13 NOTE — Progress Notes (Signed)
Pharmacy Antibiotic Note  Sh[facility] Nguyen is a 65 y.o. male admitted on 05/12/2020 with altered mental status.  Pharmacy has been consulted for cefepime dosing.  Plan: Cefepime 2g IV q12h Flagyl 500mg  IV K8Y Follow up renal function & cultures F/u PCT  Height: 6' (182.9 cm) Weight: 54.4 kg (120 lb) IBW/kg (Calculated) : 77.6  Temp (24hrs), Avg:97.2 F (36.2 C), Min:97.2 F (36.2 C), Max:97.2 F (36.2 C)  Recent Labs  Lab 05/12/20 2336 05/13/20 0419 05/13/20 0655  WBC 6.7  --   --   CREATININE 1.26*  --   --   LATICACIDVEN 4.8* 5.1* 4.5*    Estimated Creatinine Clearance: 46.2 mL/min (A) (by C-G formula based on SCr of 1.26 mg/dL (H)).    No Known Allergies  Antimicrobials this admission: 3/16 Vanc x 1 3/16 Cefepime >> 3/16 Flagyl >>  Dose adjustments this admission:  Microbiology results: 3/16 BCx:  3/16 UCx: 3/16 MRSA PCR:  Thank you for allowing pharmacy to be a part of this patient's care.  Peggyann Juba, PharmD, BCPS Pharmacy: [phone] 05/13/2020 10:01 AM

## 2020-05-13 NOTE — ED Notes (Addendum)
Bed alarm plugged in and operating appropriately. Pt reminded to call for help and to not get up without assisstance

## 2020-05-13 NOTE — Progress Notes (Signed)
Notified of increasing lactic acid. Vitals look good. He is clinically unchanged from this morning. Suspicion for seizures is low as he is able to carry on a conversation throughout the morning. Rpt CMP. Let's see his renal function. If ok, can check CT ab/pelvis w/ contrast to r/o anything necrotic or CA. Can also add EEG, although this is likely more low yield. He does have a history of cirrhosis, and may be having difficulty clearing lactic acid. Going to SDU.

## 2020-05-13 NOTE — Progress Notes (Signed)
Following for Code Sepsis  

## 2020-05-13 NOTE — ED Notes (Signed)
In and out cath performed for urine sample. Coude cath used

## 2020-05-13 NOTE — ED Notes (Signed)
Patient cleaned and peri care performed, bed linens and gown changed, pt repositioned for comfort, and bed alarm still in place.

## 2020-05-13 NOTE — H&P (Addendum)
History and Physical    Shawn Nguyen OFB:[id number] DOB: [birth year] DOA: 05/12/2020  PCP: Tresa Garter, MD  Patient coming from: Home  Chief Complaint: Altered mental status  HPI: Shawn Nguyen is a 65 y.o. male with medical history significant of EtOH abuse, HTN, cirrhosis, Hep C. Presenting with AMS. Patient is still confused and unable to give history. Thus, history is from chart review. Apparently, his neighbors had not seen him for a few days. They noticed that his mail was stacking up. They checked on him and found him hanging off his couch. He was disheveled and confused. They called for EMS for transport to the ED.     ED Course: CXR was negative for acute process. CTH/c-spine was negative for acute process.  He was found to be confused with elevated Scr and lactic acid. He was started on sepsis protocol. TRH was called for admission.    Review of Systems:  Unable to obtain d/t mentation.  PMHx Past Medical History:  Diagnosis Date  . Alcoholism ([place])   . Ascites 03/2013    6 liter paracentesis 03/2013, 9 liter tap 04/2013, 5 liter tap 08/08/13. No SBP on 6/11 tap.  . Cirrhosis ([place]) 06/14/2013  . Esophageal varices ([place]) 12/25/2013  . Gallstones 03/2013   on ultrasound.   . Hepatitis C    tested + in 07/2013.   Marland Kitchen HTN (hypertension)   . Rectal varices - small 12/25/2013  . Umbilical hernia 08/8240   draining ascites    PSHx Past Surgical History:  Procedure Laterality Date  . COLONOSCOPY N/A 12/25/2013   Procedure: COLONOSCOPY;  Surgeon: Gatha Mayer, MD;  Location: [facility];  Service: Endoscopy;  Laterality: N/A;  . ESOPHAGOGASTRODUODENOSCOPY N/A 12/25/2013   Procedure: ESOPHAGOGASTRODUODENOSCOPY (EGD);  Surgeon: Gatha Mayer, MD;  Location: [facility];  Service: Endoscopy;  Laterality: N/A;  . ESOPHAGOGASTRODUODENOSCOPY (EGD) WITH PROPOFOL N/A 03/16/2015   Procedure: ESOPHAGOGASTRODUODENOSCOPY (EGD) WITH PROPOFOL;  Surgeon: Gatha Mayer, MD;  Location:  [facility];  Service: Endoscopy;  Laterality: N/A;    SocHx  reports that he has been smoking cigarettes. He has been smoking about 0.25 packs per day. He has never used smokeless tobacco. He reports current alcohol use. He reports that he does not use drugs.  No Known Allergies  FamHx Family History  Problem Relation Age of Onset  . Diabetes Father     Prior to Admission medications   Medication Sig Start Date End Date Taking? Authorizing Provider  furosemide (LASIX) 40 MG tablet Take 1 tablet (40 mg total) by mouth daily. Patient not taking: Reported on 05/13/2020 01/27/15   Gatha Mayer, MD  omeprazole (PRILOSEC) 20 MG capsule Take 1 capsule (20 mg total) by mouth daily before breakfast. Patient not taking: Reported on 05/13/2020 01/27/15   Gatha Mayer, MD  ribavirin (REBETOL) 200 MG capsule Take 3 capsules (600 mg total) by mouth 2 (two) times daily. Patient not taking: Reported on 05/13/2020 06/30/15   Thayer Headings, MD  Sofosbuvir-Velpatasvir (EPCLUSA) 400-100 MG TABS Take 1 tablet by mouth daily. Patient not taking: Reported on 05/13/2020 06/30/15   Thayer Headings, MD  spironolactone (ALDACTONE) 25 MG tablet Take 2 tablets (50 mg total) by mouth daily. Patient not taking: Reported on 05/13/2020 01/27/15   Gatha Mayer, MD  potassium chloride SA (K-DUR,KLOR-CON) 20 MEQ tablet Take 1 tablet (20 mEq total) by mouth daily. 06/14/13 10/02/13  Lorayne Marek, MD    Physical Exam: Vitals:  05/13/20 0605 05/13/20 0615 05/13/20 0630 05/13/20 0645  BP: (!) 143/86 (!) 144/95 (!) 152/87 135/85  Pulse: 79 81 77 80  Resp: (!) 25 19 14  (!) 21  Temp:      TempSrc:      SpO2: 100% 100% 99% 100%  Weight:      Height:        General: 65 y.o. ill appearing male resting in bed in NAD Eyes: PERRL, normal sclera ENMT: Nares patent w/o discharge, orophaynx clear, dentition is poor, ears w/o discharge/lesions/ulcers Neck: Supple, trachea midline Cardiovascular: RRR, +S1, S2, no m/g/r,  equal pulses throughout Respiratory: CTABL, no w/r/r, normal WOB GI: BS+, NDNT, no masses noted, no organomegaly noted, cachetic  MSK: No e/c/c Neuro: A&O x 2 (name, year), no focal deficits Psyc: lethargic, calm, cooperative albeit sluggish  Labs on Admission: I have personally reviewed following labs and imaging studies  CBC: Recent Labs  Lab 05/12/20 2336  WBC 6.7  NEUTROABS 4.8  HGB 13.8  HCT 39.9  MCV 83.5  PLT 25*   Basic Metabolic Panel: Recent Labs  Lab 05/12/20 2336  NA 139  K 3.1*  CL 105  CO2 19*  GLUCOSE 122*  BUN 61*  CREATININE 1.26*  CALCIUM 8.1*   GFR: Estimated Creatinine Clearance: 46.2 mL/min (A) (by C-G formula based on SCr of 1.26 mg/dL (H)). Liver Function Tests: Recent Labs  Lab 05/12/20 2336  AST 62*  ALT 44  ALKPHOS 123  BILITOT 4.8*  PROT 6.9  ALBUMIN 2.3*   No results for input(s): LIPASE, AMYLASE in the last 168 hours. Recent Labs  Lab 05/12/20 2336  AMMONIA 20   Coagulation Profile: Recent Labs  Lab 05/12/20 2336  INR 1.5*   Cardiac Enzymes: Recent Labs  Lab 05/12/20 2336  CKTOTAL 54   BNP (last 3 results) No results for input(s): PROBNP in the last 8760 hours. HbA1C: No results for input(s): HGBA1C in the last 72 hours. CBG: No results for input(s): GLUCAP in the last 168 hours. Lipid Profile: No results for input(s): CHOL, HDL, LDLCALC, TRIG, CHOLHDL, LDLDIRECT in the last 72 hours. Thyroid Function Tests: No results for input(s): TSH, T4TOTAL, FREET4, T3FREE, THYROIDAB in the last 72 hours. Anemia Panel: No results for input(s): VITAMINB12, FOLATE, FERRITIN, TIBC, IRON, RETICCTPCT in the last 72 hours. Urine analysis:    Component Value Date/Time   COLORURINE AMBER (A) 05/13/2020 0200   APPEARANCEUR CLEAR 05/13/2020 0200   LABSPEC 1.020 05/13/2020 0200   PHURINE 5.0 05/13/2020 0200   GLUCOSEU NEGATIVE 05/13/2020 0200   HGBUR MODERATE (A) 05/13/2020 0200   BILIRUBINUR NEGATIVE 05/13/2020 0200    KETONESUR NEGATIVE 05/13/2020 0200   PROTEINUR 30 (A) 05/13/2020 0200   UROBILINOGEN >8.0 (H) 05/06/2013 0954   NITRITE NEGATIVE 05/13/2020 0200   LEUKOCYTESUR NEGATIVE 05/13/2020 0200    Radiological Exams on Admission: DG Chest 1 View  Result Date: 05/12/2020 CLINICAL DATA:  Fall.  Lethargy. EXAM: CHEST  1 VIEW COMPARISON:  None. FINDINGS: Lungs are hyperinflated. Normal heart size and mediastinal contours. No pneumothorax or pleural effusion. A skin fold projects over the right hemithorax on image 2 of 2. No focal airspace disease. No pulmonary edema. No acute osseous abnormalities are seen IMPRESSION: Hyperinflation without acute abnormality. Electronically Signed   By: Keith Rake M.D.   On: 05/12/2020 23:39   CT HEAD WO CONTRAST  Result Date: 05/13/2020 CLINICAL DATA:  Intoxicated, altered mental status. EXAM: CT HEAD WITHOUT CONTRAST CT CERVICAL SPINE WITHOUT CONTRAST TECHNIQUE:  Multidetector CT imaging of the head and cervical spine was performed following the standard protocol without intravenous contrast. Multiplanar CT image reconstructions of the cervical spine were also generated. COMPARISON:  None. FINDINGS: CT HEAD FINDINGS Brain: No evidence of large-territorial acute infarction. No parenchymal hemorrhage. No mass lesion. No extra-axial collection. No mass effect or midline shift. No hydrocephalus. Basilar cisterns are patent. Vascular: No hyperdense vessel. Skull: No acute fracture or focal lesion. Sinuses/Orbits: Mucosal thickening of the left maxillary sinus. Under pneumatization of the frontal sinuses. Otherwise the paranasal sinuses and mastoid air cells are clear. The orbits are unremarkable. Other: None. CT CERVICAL SPINE FINDINGS Alignment: For reversal of the normal cervical lordosis centered at the C3 level likely due to degenerative changes and positioning. Skull base and vertebrae: C3-C4 intervertebral disc space narrowing, osteophyte formation. C1-C2 degenerative  changes. No acute fracture. No aggressive appearing focal osseous lesion or focal pathologic process. Soft tissues and spinal canal: No prevertebral fluid or swelling. No visible canal hematoma. Upper chest: Trace biapical pleural/pulmonary scarring. Other: None. IMPRESSION: 1. No acute intracranial abnormality. 2. No acute displaced fracture or traumatic listhesis of the cervical spine. Electronically Signed   By: Iven Finn M.D.   On: 05/13/2020 00:58   CT CERVICAL SPINE WO CONTRAST  Result Date: 05/13/2020 CLINICAL DATA:  Intoxicated, altered mental status. EXAM: CT HEAD WITHOUT CONTRAST CT CERVICAL SPINE WITHOUT CONTRAST TECHNIQUE: Multidetector CT imaging of the head and cervical spine was performed following the standard protocol without intravenous contrast. Multiplanar CT image reconstructions of the cervical spine were also generated. COMPARISON:  None. FINDINGS: CT HEAD FINDINGS Brain: No evidence of large-territorial acute infarction. No parenchymal hemorrhage. No mass lesion. No extra-axial collection. No mass effect or midline shift. No hydrocephalus. Basilar cisterns are patent. Vascular: No hyperdense vessel. Skull: No acute fracture or focal lesion. Sinuses/Orbits: Mucosal thickening of the left maxillary sinus. Under pneumatization of the frontal sinuses. Otherwise the paranasal sinuses and mastoid air cells are clear. The orbits are unremarkable. Other: None. CT CERVICAL SPINE FINDINGS Alignment: For reversal of the normal cervical lordosis centered at the C3 level likely due to degenerative changes and positioning. Skull base and vertebrae: C3-C4 intervertebral disc space narrowing, osteophyte formation. C1-C2 degenerative changes. No acute fracture. No aggressive appearing focal osseous lesion or focal pathologic process. Soft tissues and spinal canal: No prevertebral fluid or swelling. No visible canal hematoma. Upper chest: Trace biapical pleural/pulmonary scarring. Other: None.  IMPRESSION: 1. No acute intracranial abnormality. 2. No acute displaced fracture or traumatic listhesis of the cervical spine. Electronically Signed   By: Iven Finn M.D.   On: 05/13/2020 00:58    EKG: Independently reviewed. Sinus, no ST elevation; prolonged Qt  Assessment/Plan Acute metabolic encephalopathy     - admit to inpt, progressive     - CTH/c-spine negative for acute process, CXR negative for acute process, UA is negative     - UDS positive for THC; check comprehensive serum screen     - A&O x 2 now, presumption was d/t infection, thus he was started on sepsis protocol; check MRSA and procal, he does not qualify for sepsis; however, we will continue flagyl/cefepime for now. If procal is negative, can stop abx.     - check B12, thiamine, THF   AKI     - fluids, check renal US  Lactic acidosis     - 4.8 -> 5.1 -> 4.5; continue fluids, follow  Cirrhosis of liver Elevated LFTs Hx of Hep C Hx  of EtOH abuse     - check viral load, continue fluids     - currently on CIWA, continue     - continue thiamine  Thrombocytopenia Coagulopathy     - plts 25; he has some bleeding gums right now     - get peripheral smear, fibrinogen     - spoke with onco; if abnormalities seen on smear, will formally consult     - since he does have oral bleeding, transfuse 1 unit plts     - rpt CBC after transfusion     - INR is 1.5; do not reverse right now; rpt INR in AM  Hypokalemia     - replace K+, check Mg2+   DVT prophylaxis: SCDs  Code Status: FULL  Family Communication: Attempted called to sister Sabien Umland), got VM only  Consults called: spoke with onco (Dr Earlie Server)   Status is: Inpatient  Remains inpatient appropriate because:Inpatient level of care appropriate due to severity of illness   Dispo: The patient is from: Home              Anticipated d/c is to: TBD              Patient currently is not medically stable to d/c.   Difficult to place patient No  Time  spent coordinating admission: 70 minutes  [facility]  If 7PM-7AM, please contact night-coverage [email or web address]  05/13/2020, 7:23 AM

## 2020-05-13 NOTE — Progress Notes (Signed)
Notified provider of need to order repeat lactic acid. ° °

## 2020-05-14 ENCOUNTER — Inpatient Hospital Stay (HOSPITAL_COMMUNITY): Payer: Medicaid Other

## 2020-05-14 ENCOUNTER — Inpatient Hospital Stay (HOSPITAL_COMMUNITY)
Admit: 2020-05-14 | Discharge: 2020-05-14 | Disposition: A | Discharge status: Home / Self Care | Payer: Medicaid Other | Attending: Internal Medicine | Admitting: Internal Medicine

## 2020-05-14 DIAGNOSIS — D696 Thrombocytopenia, unspecified: Secondary | ICD-10-CM

## 2020-05-14 DIAGNOSIS — G9341 Metabolic encephalopathy: Secondary | ICD-10-CM

## 2020-05-14 DIAGNOSIS — I959 Hypotension, unspecified: Secondary | ICD-10-CM

## 2020-05-14 DIAGNOSIS — G9340 Encephalopathy, unspecified: Secondary | ICD-10-CM

## 2020-05-14 DIAGNOSIS — E43 Unspecified severe protein-calorie malnutrition: Secondary | ICD-10-CM

## 2020-05-14 DIAGNOSIS — E872 Acidosis, unspecified: Secondary | ICD-10-CM

## 2020-05-14 DIAGNOSIS — R748 Abnormal levels of other serum enzymes: Secondary | ICD-10-CM

## 2020-05-14 DIAGNOSIS — R160 Hepatomegaly, not elsewhere classified: Secondary | ICD-10-CM

## 2020-05-14 DIAGNOSIS — D689 Coagulation defect, unspecified: Secondary | ICD-10-CM

## 2020-05-14 DIAGNOSIS — R7989 Other specified abnormal findings of blood chemistry: Secondary | ICD-10-CM

## 2020-05-14 LAB — MAGNESIUM: Magnesium: 2.2 mg/dL (ref 1.7–2.4)

## 2020-05-14 LAB — CBC
HCT: 36.7 % — ABNORMAL LOW (ref 39.0–52.0)
Hemoglobin: 12.9 g/dL — ABNORMAL LOW (ref 13.0–17.0)
MCH: 29.3 pg (ref 26.0–34.0)
MCHC: 35.1 g/dL (ref 30.0–36.0)
MCV: 83.4 fL (ref 80.0–100.0)
Platelets: 32 10*3/uL — ABNORMAL LOW (ref 150–400)
RBC: 4.4 MIL/uL (ref 4.22–5.81)
RDW: 15.8 % — ABNORMAL HIGH (ref 11.5–15.5)
WBC: 7.3 10*3/uL (ref 4.0–10.5)
nRBC: 1.2 % — ABNORMAL HIGH (ref 0.0–0.2)

## 2020-05-14 LAB — URINE CULTURE: Culture: NO GROWTH

## 2020-05-14 LAB — HCV RNA QUANT: HCV Quantitative: NOT DETECTED IU/mL (ref >50)

## 2020-05-14 LAB — PREPARE PLATELET PHERESIS: Unit division: 0

## 2020-05-14 LAB — LACTIC ACID, PLASMA
Lactic Acid, Venous: 1.1 mmol/L (ref 0.5–1.9)
Lactic Acid, Venous: 1.2 mmol/L (ref 0.5–1.9)

## 2020-05-14 LAB — BPAM PLATELET PHERESIS
Blood Product Expiration Date: 202203182359
ISSUE DATE / TIME: 202203161226
Unit Type and Rh: 5100

## 2020-05-14 LAB — COMPREHENSIVE METABOLIC PANEL
ALT: 32 U/L (ref 0–44)
AST: 48 U/L — ABNORMAL HIGH (ref 15–41)
Albumin: 1.8 g/dL — ABNORMAL LOW (ref 3.5–5.0)
Alkaline Phosphatase: 88 U/L (ref 38–126)
Anion gap: 4 — ABNORMAL LOW (ref 5–15)
BUN: 35 mg/dL — ABNORMAL HIGH (ref 8–23)
CO2: 26 mmol/L (ref 22–32)
Calcium: 7.7 mg/dL — ABNORMAL LOW (ref 8.9–10.3)
Chloride: 113 mmol/L — ABNORMAL HIGH (ref 98–111)
Creatinine, Ser: 0.88 mg/dL (ref 0.61–1.24)
GFR, Estimated: >60 mL/min (ref >60)
Glucose, Bld: 83 mg/dL (ref 70–99)
Potassium: 3.4 mmol/L — ABNORMAL LOW (ref 3.5–5.1)
Sodium: 143 mmol/L (ref 135–145)
Total Bilirubin: 5 mg/dL — ABNORMAL HIGH (ref 0.3–1.2)
Total Protein: 5.3 g/dL — ABNORMAL LOW (ref 6.5–8.1)

## 2020-05-14 LAB — PROCALCITONIN: Procalcitonin: 3.68 ng/mL

## 2020-05-14 LAB — TSH: TSH: 4.598 u[IU]/mL — ABNORMAL HIGH (ref 0.350–4.500)

## 2020-05-14 MED ORDER — SODIUM CHLORIDE 0.9 % IV SOLN
INTRAVENOUS | Status: DC | PRN
  Administered 2020-05-14: 250 mL via INTRAVENOUS

## 2020-05-14 MED ORDER — THIAMINE HCL 100 MG/ML IJ SOLN
500.0000 mg | Freq: Three times a day (TID) | INTRAVENOUS | Status: DC
  Administered 2020-05-14 – 2020-05-15 (×3): 500 mg via INTRAVENOUS
  Filled 2020-05-14 (×4): qty 5

## 2020-05-14 MED ORDER — POTASSIUM CHLORIDE CRYS ER 20 MEQ PO TBCR: 40.0000 meq | EXTENDED_RELEASE_TABLET | Freq: Once | ORAL | Status: DC

## 2020-05-14 MED ORDER — THIAMINE HCL 100 MG/ML IJ SOLN: 250.0000 mg | Freq: Every day | INTRAVENOUS | Status: DC

## 2020-05-14 MED ORDER — THIAMINE HCL 100 MG PO TABS: 100.0000 mg | ORAL_TABLET | Freq: Every day | ORAL | Status: DC

## 2020-05-14 MED ORDER — LACTATED RINGERS IV BOLUS
1000.0000 mL | Freq: Once | INTRAVENOUS | Status: AC
  Administered 2020-05-14: 1000 mL via INTRAVENOUS

## 2020-05-14 MED ORDER — POTASSIUM CHLORIDE 10 MEQ/100ML IV SOLN
10.0000 meq | INTRAVENOUS | Status: AC
  Administered 2020-05-14 (×6): 10 meq via INTRAVENOUS
  Filled 2020-05-14 (×4): qty 100

## 2020-05-14 MED ORDER — BENZOCAINE 20 % MT AERO
INHALATION_SPRAY | Freq: Once | OROMUCOSAL | Status: AC
  Filled 2020-05-14: qty 57

## 2020-05-14 MED ORDER — LORAZEPAM 2 MG/ML IJ SOLN
1.0000 mg | INTRAMUSCULAR | Status: DC | PRN
  Administered 2020-05-14: 3 mg via INTRAVENOUS
  Filled 2020-05-14: qty 2

## 2020-05-14 MED ORDER — THIAMINE HCL 100 MG/ML IJ SOLN: 100.0000 mg | Freq: Every day | INTRAMUSCULAR | Status: DC

## 2020-05-14 MED ORDER — BENZOCAINE 20 % MT AERO: INHALATION_SPRAY | OROMUCOSAL | Status: DC

## 2020-05-14 MED ORDER — SODIUM CHLORIDE 0.9 % IV SOLN
2.0000 g | Freq: Three times a day (TID) | INTRAVENOUS | Status: DC
  Administered 2020-05-14 – 2020-05-15 (×3): 2 g via INTRAVENOUS
  Filled 2020-05-14 (×3): qty 2

## 2020-05-14 MED ORDER — ADULT MULTIVITAMIN W/MINERALS CH: 1.0000 | ORAL_TABLET | Freq: Every day | ORAL | Status: DC

## 2020-05-14 MED ORDER — SODIUM CHLORIDE 0.9 % IV BOLUS: 500.0000 mL | Freq: Once | INTRAVENOUS | Status: DC

## 2020-05-14 MED ORDER — LORAZEPAM 1 MG PO TABS: 1.0000 mg | ORAL_TABLET | ORAL | Status: DC | PRN

## 2020-05-14 MED ORDER — LORAZEPAM 2 MG/ML IJ SOLN: 1.0000 mg | INTRAMUSCULAR | Status: DC | PRN

## 2020-05-14 MED ORDER — FOLIC ACID 1 MG PO TABS: 1.0000 mg | ORAL_TABLET | Freq: Every day | ORAL | Status: DC

## 2020-05-14 MED ORDER — MIDODRINE HCL 5 MG PO TABS
5.0000 mg | ORAL_TABLET | Freq: Three times a day (TID) | ORAL | Status: DC
  Administered 2020-05-14: 5 mg via ORAL
  Filled 2020-05-14: qty 1

## 2020-05-14 MED ORDER — LORAZEPAM 1 MG PO TABS
1.0000 mg | ORAL_TABLET | ORAL | Status: DC | PRN
Start: 2020-05-14 — End: 2020-05-15

## 2020-05-14 MED ORDER — LACTULOSE 10 GM/15ML PO SOLN
20.0000 g | ORAL | Status: DC
  Administered 2020-05-14 – 2020-05-15 (×9): 20 g via ORAL
  Filled 2020-05-14 (×9): qty 30

## 2020-05-14 NOTE — Evaluation (Signed)
SLP Cancellation Note  Patient Details Name: Shawn Nguyen MRN: [id number] DOB: 1955/10/01   Cancelled treatment:       Reason Eval/Treat Not Completed: Other (comment);Fatigue/lethargy limiting ability to participate (pt has been too lethargic for po or evaluation, will continue efforts, note plans for IR BX of liver mass).    Kathleen Lime, MS [facility] SLP Acute Rehab Services Office [phone] Pager [phone]   Macario Golds 05/14/2020, 4:02 PM

## 2020-05-14 NOTE — Progress Notes (Signed)
Pharmacy Antibiotic Note  Shawn Nguyen is a 65 y.o. male admitted on 05/12/2020 with altered mental status.  Pharmacy has been consulted for cefepime dosing.   Today, with pt renal function improving - SCr now 0.88    Plan: Increase Cefepime dose to 2g IV q8h  Flagyl 500mg  IV q8h Follow up renal function & cultures F/u PCT  Height: 6' (182.9 cm) Weight: 54.4 kg (120 lb) IBW/kg (Calculated) : 77.6  Temp (24hrs), Avg:97.5 F (36.4 C), Min:96.6 F (35.9 C), Max:98 F (36.7 C)  Recent Labs  Lab 05/12/20 2336 05/13/20 0419 05/13/20 0655 05/13/20 1007 05/13/20 1630 05/14/20 0242  WBC 6.7  --   --   --   --  7.3  CREATININE 1.26*  --   --   --  0.85 0.88  LATICACIDVEN 4.8* 5.1* 4.5* 7.2*  --   --     Estimated Creatinine Clearance: 66.1 mL/min (by C-G formula based on SCr of 0.88 mg/dL).    No Known Allergies  Antimicrobials this admission: 3/16 Vanc x 1 3/16 Cefepime >> 3/16 Flagyl >>  Dose adjustments this admission:  Microbiology results: 3/16 BCx: NGTD 3/16 UCx: NGF 3/16 MRSA PCR:  Thank you for allowing pharmacy to be a part of this patient's care.   Royetta Asal, PharmD, BCPS 05/14/2020 9:18 AM

## 2020-05-14 NOTE — Procedures (Signed)
Patient Name: Shawn Nguyen  MRN: [id number]  Epilepsy Attending: Lora Havens  Referring Physician/Provider: Dr Cherylann Ratel Date: 05/14/2020  Duration: 23.19 mins  Patient history: 65yo M with ams. EEG to evaluate for seizure  Level of alertness: Awake  AEDs during EEG study: None  Technical aspects: This EEG study was done with scalp electrodes positioned according to the 10-20 International system of electrode placement. Electrical activity was acquired at a sampling rate of 500Hz  and reviewed with a high frequency filter of 70Hz  and a low frequency filter of 1Hz . EEG data were recorded continuously and digitally stored.   Description: No posterior dominant rhythm was seen. EEG showed continuous generalized 5 to 7 Hz theta slowing. Hyperventilation and photic stimulation were not performed.     ABNORMALITY - Continuous slow, generalized  IMPRESSION: This study is suggestive ofmoderate diffuse encephalopathy, nonspecific etiology. No seizures or epileptiform discharges were seen throughout the recording.   Shawn Nguyen

## 2020-05-14 NOTE — Consult Note (Signed)
Chief Complaint: Patient was seen in consultation today for liver mass/biopsy.  Referring Physician(s): [facility])  Supervising Physician: Jacqulynn Cadet  Patient Status: [facility] - In-pt  History of Present Illness: Shawn Nguyen is a 65 y.o. male with a past medical history of hypertension, alcoholic cirrhosis with associated esophageal/rectal varices, Hepatitis C, and nephrolithiasis. He presented to [facility] ED 05/12/2020 via EMS with complaints of AMS. Per notes, neighbors had not seen patient in nearly a week, went to check on him and found him disheveled and altered on couch. In ED, Scr and lactic acid was elevated, CT head unremarkable- he was admitted for further management. CT abdomen/pelvis revealed liver mass concerning for metastatic process.  CT abdomen/pelvis 05/13/2020: 1. 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 This mass is likely amenable to ultrasound-guided biopsy. 2. Patchy consolidation left lower lobe favor airspace disease over atelectasis. Aspiration could be considered in the appropriate setting. 3. Aortic Atherosclerosis (ICD10-I70.0). Prominent eccentric atheromatous plaque within the mid abdominal aorta and at the origin of the right common iliac artery, with approximately 50% stenosis at each location. 4. Cholelithiasis, with mild nonspecific gallbladder wall thickening felt to be due to trace ascites and hypoproteinemic state given background hepatic cirrhosis.  IR consulted by Dr. Cyndia Skeeters for possible liver mass biopsy. Patient sitting in bed resting, mouth open. Appears lethargic- opens eyes to name but quickly shuts them, nonverbal, dried blood on lips. RN at bedside- plans for NGT placement today. History difficult to obtain  secondary to lethargy.   Past Medical History:  Diagnosis Date  . Alcoholism ([place])   . Ascites 03/2013    6 liter paracentesis 03/2013, 9 liter tap 04/2013, 5 liter tap 08/08/13. No SBP on 6/11 tap.  . Cirrhosis ([place]) 06/14/2013  . Esophageal varices ([place]) 12/25/2013  . Gallstones 03/2013   on ultrasound.   . Hepatitis C    tested + in 07/2013.   Marland Kitchen HTN (hypertension)   . Rectal varices - small 12/25/2013  . Umbilical hernia 04/8248   draining ascites    Past Surgical History:  Procedure Laterality Date  . COLONOSCOPY N/A 12/25/2013   Procedure: COLONOSCOPY;  Surgeon: Gatha Mayer, MD;  Location: [facility] ENDOSCOPY;  Service: Endoscopy;  Laterality: N/A;  . ESOPHAGOGASTRODUODENOSCOPY N/A 12/25/2013   Procedure: ESOPHAGOGASTRODUODENOSCOPY (EGD);  Surgeon: Gatha Mayer, MD;  Location: [facility];  Service: Endoscopy;  Laterality: N/A;  . ESOPHAGOGASTRODUODENOSCOPY (EGD) WITH PROPOFOL N/A 03/16/2015   Procedure: ESOPHAGOGASTRODUODENOSCOPY (EGD) WITH PROPOFOL;  Surgeon: Gatha Mayer, MD;  Location: [facility] ENDOSCOPY;  Service: Endoscopy;  Laterality: N/A;    Allergies: Patient has no known allergies.  Medications: Prior to Admission medications   Medication Sig Start Date End Date Taking? Authorizing Provider  furosemide (LASIX) 40 MG tablet Take 1 tablet (40 mg total) by mouth daily. Patient not taking: Reported on 05/13/2020 01/27/15   Gatha Mayer, MD  omeprazole (PRILOSEC) 20 MG capsule Take 1 capsule (20 mg total) by mouth daily before breakfast. Patient not taking: Reported on 05/13/2020 01/27/15   Gatha Mayer, MD  ribavirin (REBETOL) 200 MG capsule Take 3 capsules (600 mg total) by mouth 2 (two) times daily. Patient not taking: Reported on 05/13/2020 06/30/15   Thayer Headings, MD  Sofosbuvir-Velpatasvir (EPCLUSA) 400-100 MG TABS Take 1 tablet by mouth daily. Patient not taking:  Reported on 05/13/2020 06/30/15   Thayer Headings, MD  spironolactone (ALDACTONE) 25 MG tablet Take 2  tablets (50 mg total) by mouth daily. Patient not taking: Reported on 05/13/2020 01/27/15   Gatha Mayer, MD  potassium chloride SA (K-DUR,KLOR-CON) 20 MEQ tablet Take 1 tablet (20 mEq total) by mouth daily. 06/14/13 10/02/13  Lorayne Marek, MD     Family History  Problem Relation Age of Onset  . Diabetes Father     Social History   Socioeconomic History  . Marital status: Single    Spouse name: Not on file  . Number of children: 2  . Years of education: Not on file  . Highest education level: Not on file  Occupational History  . Not on file  Tobacco Use  . Smoking status: Current Some Day Smoker    Packs/day: 0.25    Types: Cigarettes  . Smokeless tobacco: Never Used  . Tobacco comment: Smoking 1-2 every other day  Substance and Sexual Activity  . Alcohol use: Yes    Alcohol/week: 0.0 standard drinks    Comment: occasional  . Drug use: No    Comment: former 2005  . Sexual activity: Not on file  Other Topics Concern  . Not on file  Social History Narrative   single, 1 son one daughter, formerly worked as a Biochemist, clinical, he has been unemployed for a number of years.   Social Determinants of Health   Financial Resource Strain: Not on file  Food Insecurity: Not on file  Transportation Needs: Not on file  Physical Activity: Not on file  Stress: Not on file  Social Connections: Not on file     Review of Systems: A 12 point ROS discussed and pertinent positives are indicated in the HPI above.  All other systems are negative.  Review of Systems  Unable to perform ROS: Mental status change    Vital Signs: BP 107/66 (BP Location: Left Arm)   Pulse 80   Temp (!) 96.9 F (36.1 C) (Axillary)   Resp (!) 23   Ht 6' (1.829 m)   Wt 120 lb (54.4 kg)   SpO2 98%   BMI 16.27 kg/m   Physical Exam Constitutional:      General: He is not in acute distress.    Comments: Lethargic.  HENT:     Mouth/Throat:     Comments: Mouth open, dried blood on  lips. Cardiovascular:     Rate and Rhythm: Normal rate and regular rhythm.     Heart sounds: Normal heart sounds. No murmur heard.   Pulmonary:     Effort: Pulmonary effort is normal. No respiratory distress.     Breath sounds: Normal breath sounds. No wheezing.  Skin:    General: Skin is warm and dry.  Neurological:     Comments: Lethargic- opens eyes to name but quickly shuts them.  Psychiatric:     Comments: Lethargic.      MD Evaluation Airway: WNL Heart: WNL Abdomen: WNL Chest/ Lungs: WNL ASA  Classification: 3 Mallampati/Airway Score: Two   Imaging: DG Chest 1 View  Result Date: 05/12/2020 CLINICAL DATA:  Fall.  Lethargy. EXAM: CHEST  1 VIEW COMPARISON:  None. FINDINGS: Lungs are hyperinflated. Normal heart size and mediastinal contours. No pneumothorax or pleural effusion. A skin fold projects over the right hemithorax on image 2 of 2. No focal airspace disease. No pulmonary edema. No acute osseous abnormalities are seen IMPRESSION: Hyperinflation without acute abnormality. Electronically Signed  By: Keith Rake M.D.   On: 05/12/2020 23:39   CT HEAD WO CONTRAST  Result Date: 05/13/2020 CLINICAL DATA:  Intoxicated, altered mental status. EXAM: CT HEAD WITHOUT CONTRAST CT CERVICAL SPINE WITHOUT CONTRAST TECHNIQUE: Multidetector CT imaging of the head and cervical spine was performed following the standard protocol without intravenous contrast. Multiplanar CT image reconstructions of the cervical spine were also generated. COMPARISON:  None. FINDINGS: CT HEAD FINDINGS Brain: No evidence of large-territorial acute infarction. No parenchymal hemorrhage. No mass lesion. No extra-axial collection. No mass effect or midline shift. No hydrocephalus. Basilar cisterns are patent. Vascular: No hyperdense vessel. Skull: No acute fracture or focal lesion. Sinuses/Orbits: Mucosal thickening of the left maxillary sinus. Under pneumatization of the frontal sinuses. Otherwise the  paranasal sinuses and mastoid air cells are clear. The orbits are unremarkable. Other: None. CT CERVICAL SPINE FINDINGS Alignment: For reversal of the normal cervical lordosis centered at the C3 level likely due to degenerative changes and positioning. Skull base and vertebrae: C3-C4 intervertebral disc space narrowing, osteophyte formation. C1-C2 degenerative changes. No acute fracture. No aggressive appearing focal osseous lesion or focal pathologic process. Soft tissues and spinal canal: No prevertebral fluid or swelling. No visible canal hematoma. Upper chest: Trace biapical pleural/pulmonary scarring. Other: None. IMPRESSION: 1. No acute intracranial abnormality. 2. No acute displaced fracture or traumatic listhesis of the cervical spine. Electronically Signed   By: Iven Finn M.D.   On: 05/13/2020 00:58   CT CERVICAL SPINE WO CONTRAST  Result Date: 05/13/2020 CLINICAL DATA:  Intoxicated, altered mental status. EXAM: CT HEAD WITHOUT CONTRAST CT CERVICAL SPINE WITHOUT CONTRAST TECHNIQUE: Multidetector CT imaging of the head and cervical spine was performed following the standard protocol without intravenous contrast. Multiplanar CT image reconstructions of the cervical spine were also generated. COMPARISON:  None. FINDINGS: CT HEAD FINDINGS Brain: No evidence of large-territorial acute infarction. No parenchymal hemorrhage. No mass lesion. No extra-axial collection. No mass effect or midline shift. No hydrocephalus. Basilar cisterns are patent. Vascular: No hyperdense vessel. Skull: No acute fracture or focal lesion. Sinuses/Orbits: Mucosal thickening of the left maxillary sinus. Under pneumatization of the frontal sinuses. Otherwise the paranasal sinuses and mastoid air cells are clear. The orbits are unremarkable. Other: None. CT CERVICAL SPINE FINDINGS Alignment: For reversal of the normal cervical lordosis centered at the C3 level likely due to degenerative changes and positioning. Skull base and  vertebrae: C3-C4 intervertebral disc space narrowing, osteophyte formation. C1-C2 degenerative changes. No acute fracture. No aggressive appearing focal osseous lesion or focal pathologic process. Soft tissues and spinal canal: No prevertebral fluid or swelling. No visible canal hematoma. Upper chest: Trace biapical pleural/pulmonary scarring. Other: None. IMPRESSION: 1. No acute intracranial abnormality. 2. No acute displaced fracture or traumatic listhesis of the cervical spine. Electronically Signed   By: Iven Finn M.D.   On: 05/13/2020 00:58   CT ABDOMEN PELVIS W CONTRAST  Result Date: 05/13/2020 CLINICAL DATA:  Abdominal pain, hepatitis C, cirrhosis EXAM: CT ABDOMEN AND PELVIS WITH CONTRAST TECHNIQUE: Multidetector CT imaging of the abdomen and pelvis was performed using the standard protocol following bolus administration of intravenous contrast. CONTRAST:  149mL OMNIPAQUE IOHEXOL 300 MG/ML  SOLN COMPARISON:  11/10/2015 FINDINGS: Lower chest: Patchy consolidation within the left lower lobe may reflect hypoventilatory change, airspace disease, or even aspiration. No effusion or pneumothorax. Hepatobiliary: The liver is cirrhotic. A large infiltrating mass is seen throughout the right lobe of the liver, measuring up to 7.7 x 5.7 by 10.0 cm. Multiple central  areas of necrosis are identified. This mass demonstrates heterogeneous enhancement, with areas of delayed washout and delayed enhancement identified especially along the inferior margin of this mass. There are areas of intrahepatic biliary dilation, as well as tumor thrombus extending into the middle hepatic vein. Overall, imaging characteristics are consistent with cholangiocarcinoma or biphenotypic hepatocellular carcinoma. There are calcified gallstones dependently within the gallbladder. Mild diffuse nonspecific gallbladder wall thickening is noted. No evidence of common bile duct dilation. Pancreas: Unremarkable. No pancreatic ductal dilatation  or surrounding inflammatory changes. Spleen: Normal in size without focal abnormality. Adrenals/Urinary Tract: Adrenal glands are unremarkable. Kidneys are normal, without renal calculi, focal lesion, or hydronephrosis. Bladder is unremarkable. Stomach/Bowel: No bowel obstruction or ileus. No bowel wall thickening or inflammatory change. Vascular/Lymphatic: There is significant eccentric posterior atheromatous plaque within the abdominal aorta just inferior to the level of the renal arteries. This plaque extends approximately 3.5 cm in length, narrows the lumen of the aorta by approximately 50%. Similar atheromatous plaque is seen at the origin of the right common iliac artery with estimated 50% stenosis. Diffuse atherosclerosis throughout the remainder of the aorta and its branches. 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 Remaining venous structures are unremarkable. No pathologic adenopathy within the abdomen or pelvis. Reproductive: Prostate is unremarkable. Other: Trace ascites within the right upper quadrant. No free intraperitoneal gas. No abdominal wall hernia. Musculoskeletal: No acute or destructive bony lesions. There is grade 1 anterolisthesis of L3 on L4, with bilateral L3 spondylolysis. Moderate spondylosis at the L3-4 level. Reconstructed images demonstrate no additional findings. IMPRESSION: 1. 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 This mass is likely amenable to ultrasound-guided biopsy. 2. Patchy consolidation left lower lobe favor airspace disease over atelectasis. Aspiration could be considered in the appropriate setting. 3. Aortic Atherosclerosis (ICD10-I70.0). Prominent eccentric atheromatous plaque within the mid abdominal aorta and at the origin  of the right common iliac artery, with approximately 50% stenosis at each location. 4. Cholelithiasis, with mild nonspecific gallbladder wall thickening felt to be due to trace ascites and hypoproteinemic state given background hepatic cirrhosis. Electronically Signed   By: Randa Ngo M.D.   On: 05/13/2020 21:24   US RENAL  Result Date: 05/13/2020 CLINICAL DATA:  Acute kidney injury. EXAM: RENAL / URINARY TRACT ULTRASOUND COMPLETE COMPARISON:  None. FINDINGS: Right Kidney: Renal measurements: 12.4 x 5.2 x 9.2 cm = volume: 242 mL. Echogenicity within normal limits. No mass or hydronephrosis visualized. Left Kidney: Renal measurements: 11.2 x 6.2 x 5.9 cm = volume: 214 mL. Echogenicity within normal limits. No mass or hydronephrosis visualized. Bladder: Appears normal for degree of bladder distention. No ureteral jets are noted. Other: None. IMPRESSION: Normal renal ultrasound. Electronically Signed   By: Marijo Conception M.D.   On: 05/13/2020 16:38   DG Chest Port 1 View  Result Date: 05/14/2020 CLINICAL DATA:  Hypertension, cirrhosis, hepatitis-C. Altered mental status. EXAM: PORTABLE CHEST 1 VIEW COMPARISON:  05/12/2020 FINDINGS: Increasing airspace opacity in the left lower lung concerning for early infiltrate. Right lung clear. Heart is normal size. No effusions. Mild hyperinflation, stable. No acute bony abnormality. IMPRESSION: Stable hyperinflation. Increasing vague opacity in the left lower lung concerning for early infiltrate/pneumonia. Electronically Signed   By: Rolm Baptise M.D.   On: 05/14/2020 09:14    Labs:  CBC: Recent Labs  05/12/20 2336 05/14/20 0242  WBC 6.7 7.3  HGB 13.8 12.9*  HCT 39.9 36.7*  PLT 25* 32*    COAGS: Recent Labs    05/12/20 2336  INR 1.5*    BMP: Recent Labs    05/12/20 2336 05/13/20 1630 05/14/20 0242  NA 139 141 143  K 3.1* 3.1* 3.4*  CL 105 109 113*  CO2 19* 19* 26  GLUCOSE 122* 92 83  BUN 61* 39* 35*  CALCIUM 8.1* 7.8* 7.7*   CREATININE 1.26* 0.85 0.88  GFRNONAA >60 >60 >60    LIVER FUNCTION TESTS: Recent Labs    05/12/20 2336 05/13/20 1630 05/14/20 0242  BILITOT 4.8* 4.9* 5.0*  AST 62* 55* 48*  ALT 44 36 32  ALKPHOS 123 103 88  PROT 6.9 5.9* 5.3*  ALBUMIN 2.3* 2.0* 1.8*     Assessment and Plan:  Liver mass in setting of Hepatitis C and alcoholic cirrhosis. Plan for image-guided liver mass biopsy in IR tentatively for tomorrow 05/15/2020 pending IR scheduling. Patient will be NPO at midnight prior to procedure including tube feeds. Afebrile. He does not take blood thinners. INR 1.5 05/12/2020.  Risks and benefits discussed with the patient including, but not limited to bleeding, infection, damage to adjacent structures or low yield requiring additional tests. All of the patient's sister's questions were answered, she is agreeable to proceed. Consent obtained by patient's sister, Ashe Gago, via telephone- signed and in chart.   Thank you for this interesting consult.  I greatly enjoyed meeting Meryl Ponder and look forward to participating in their care.  A copy of this report was sent to the requesting provider on this date.  Electronically Signed: Earley Abide, PA-C 05/14/2020, 12:58 PM   I spent a total of 20 Minutes in face to face in clinical consultation, greater than 50% of which was counseling/coordinating care for liver mass/biopsy.

## 2020-05-14 NOTE — Progress Notes (Signed)
EEG Completed; Results Pending  

## 2020-05-14 NOTE — Progress Notes (Signed)
Initial Nutrition Assessment  RD working remotely.   DOCUMENTATION CODES:   Underweight  INTERVENTION:  - diet advancement as medically feasible. - will complete NFPE when feasible.    NUTRITION DIAGNOSIS:   Inadequate oral intake related to inability to eat as evidenced by NPO status.  GOAL:   Patient will meet greater than or equal to 90% of their needs  MONITOR:   Diet advancement,Labs,Weight trends  REASON FOR ASSESSMENT:   Malnutrition Screening Tool  ASSESSMENT:   65 y.o. male with medical history of alcohol abuse, HTN, cirrhosis, ascites, esophageal and rectal varices, umbilical hernia, and hepatitis C. He presented to the ED via EMS due to AMS after being found disheveled and confused by neighbors.  Diet changed from Heart Healthy to NPO today at 0855; no intakes documented from dinner last night or breakfast this AM.   Patient is noted to be incomprehensible, does not follow commands, orientation is unable to be assessed. RD is working off site today.   He has not been assessed by a [facility] RD at any time in the past.   Weight on 3/15 was documented as 120 lb which appears to be a stated weight. PTA, the most recently documented weight on 11/03/15 when he weighed 186 lb.   Per notes: - acute metabolic encephalopathy with CXR and UA negative, thought to possible be d/t infection - started on sepsis protocol - AKI - lactic acidosis - hypokalemia--repletion ordered   Labs reviewed; K: 3.4 mmol/l, Cl: 113 mmol/l, BUN: 35 mg/dl, Ca: 7.7 mg/dl. Medications reviewed; 10 mEq IV KCl x4 runs 3/16, 40 mEq Klor-Con x1 dose 3/17, 100 mg thiamine/day.     NUTRITION - FOCUSED PHYSICAL EXAM:  unable to complete at this time.   Diet Order:   Diet Order            Diet NPO time specified  Diet effective now                 EDUCATION NEEDS:   Not appropriate for education at this time  Skin:  Skin Assessment: Reviewed RN Assessment  Last BM:   PTA/unknown  Height:   Ht Readings from Last 1 Encounters:  05/12/20 6' (1.829 m)    Weight:   Wt Readings from Last 1 Encounters:  05/12/20 54.4 kg     Estimated Nutritional Needs:  Kcal:  1825-2050 kcal Protein:  95-110 grams Fluid:  >/= 2.2 L/day      Jarome Matin, MS, RD, LDN, CNSC Inpatient Clinical Dietitian RD pager # available in [place]  After hours/weekend pager # available in [facility]

## 2020-05-14 NOTE — Progress Notes (Signed)
PROGRESS NOTE  Shawn Nguyen WPY:[id number] DOB: 1955-12-01   PCP: Tresa Garter, MD  Patient is from: Home.  Lives alone.  DOA: 05/12/2020 LOS: 1  Chief complaints: Altered mental status  Brief Narrative / Interim history: 65 year old male with history of EtOH abuse, hepatitis C, cirrhosis, esophageal varices and HTN brought to ED by EMS after found hanging of his Coreg by his neighbor who called EMS.   In ED, afebrile.  Hemodynamically stable.  Saturating in upper 90s on RA.  WBC 6.7.  Platelets 25.  K3.1.  Bicarb 19. Cr 1.26.  BUN 61.  AST 62.  Total bili 4.8.  Direct 2.5.  CK 54.  Troponin 39> 47.  Lactic acid 4.8>>5.1.  CXR without acute finding.  CT H/C-spine without acute finding.  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  Cultures obtained.  Patient was started on IV fluid and broad-spectrum antibiotics, and admitted for acute encephalopathy and AKI  Subjective: Seen and examined earlier this morning.  Was hypotensive to 80s/50s.  BP briefly improved after IV fluid bolus but dropped again to 80s/50s.  Saturating in upper 90s on 2 L.  No fever.  He remains somnolent.   Objective: Vitals:   05/14/20 0800 05/14/20 0900 05/14/20 1000 05/14/20 1100  BP: (!) 93/58 (!) 89/55 117/62 (!) 86/53  Pulse: 75 74 78 75  Resp: (!) 21 (!) 21 (!) 22 20  Temp: (!) 96.6 F (35.9 C)     TempSrc: Axillary  Axillary   SpO2: 93% 97% 98% 96%  Weight:      Height:        Intake/Output Summary (Last 24 hours) at 05/14/2020 1145 Last data filed at 05/14/2020 0600 Gross per 24 hour  Intake 4237.21 ml  Output 475 ml  Net 3762.21 ml   Filed Weights   05/12/20 2322  Weight: 54.4 kg    Examination:  GENERAL: Frail and chronically ill-appearing. HEENT: Poor dentition.  Vision and hearing grossly intact.  NECK: Supple.  No apparent JVD.   RESP: 98% on 2 L.  No IWOB.  Very rhonchorous CVS:  RRR. Heart sounds normal.  ABD/GI/GU: BS+. Abd soft, NTND.  MSK/EXT:  Moves extremities. No apparent deformity. No edema.  Muscle mass and subcu fat loss. SKIN: no apparent skin lesion or wound NEURO: Somnolent.  Wakes to noxious stimuli but does not stay awake.  Does not follow command.  PERRL.  No facial asymmetry.  Patellar reflex symmetric.  No apparent focal neuro deficit but limited exam. PSYCH: Somnolent.  No distress or agitation.  Procedures:  None  Microbiology summarized: [id number] and influenza PCR nonreactive. MRSA PCR screening negative. Blood cultures NGTD. Urine cultures NGTD.  Assessment & Plan: Acute metabolic encephalopathy-unclear etiology but concern for aspiration pneumonia and EtOH abuse.  He received 2 doses of Ativan.  Last dose was about midnight.  Work-up including ammonia, B12, CTH/C-spine without significant finding.  UDS positive for marijuana. -Make patient n.p.o. -Treat pneumonia as below -Check TSH and RPR -Follow EEG -Start trial of high-dose thiamine -Adjusted his Ativan dose with CIWA.  Aspiration pneumonia: CT abdomen and pelvis revealed LLL consolidation concerning for aspiration pneumonia given patient's mental status and respiratory secretions.  Procalcitonin elevated. -Continue IV cefepime and IV Flagyl -N.p.o. and aspiration precautions -Will try chest physiotherapy -PCCM, Dr. Silas Flood to evaluate  Liver cirrhosis/liver mass-CT abdomen and pelvis revealed  7.7 x 5.7 by 10.0 cm mass concerning for malignancy given patient's history of liver cirrhosis and hep C -IR consulted for needle biopsy.  AKI/azotemia: Likely due to dehydration.  Appears dry on exam.  AKI resolved. -Give LR bolus 1 L and reassess -Avoid nephrotoxic meds  Elevated liver enzymes/hyperbilirubinemia: Liver enzymes improved.  Total bili remains elevated. -Continue trending -Check LDH  Elevated troponin: Likely  demand ischemia and delayed clearance. -Check TTE given hypotension  Lactic acidosis: could be due to infection (pneumonia) and dehydration.  Resolved.  Hypokalemia: Magnesium was normal. -Replenish and recheck.  Tumor thrombus of hepatic vein -Management as above  History of hepatitis C-seems to be treated for this in 2017 -Follow hep C RNA result  History of alcohol abuse: No significant withdrawal symptoms. -Continue CIWA monitoring -Trial of high-dose thiamine as above  Thrombocytopenia: Likely due to liver cirrhosis from alcohol.  Improved. -Continue monitoring -SCD for VTE prophylaxis  Coagulopathy: Likely due to liver failure. -Continue monitoring  Poor dentition -He may need dental extractions  Aortic atherosclerosis -Need to statins in the future  Severe malnutrition: As evidenced by significant muscle mass and subcu fat loss a low albumin level Body mass index is 16.27 kg/m. Nutrition Problem: Inadequate oral intake Etiology: inability to eat Signs/Symptoms: NPO status Interventions: Refer to RD note for recommendations   DVT prophylaxis:  SCDs Start: 05/13/20 1548  Code Status: Full code Family Communication: Updated patient's sister over the phone. Level of care: Stepdown Status is: Inpatient  Remains inpatient appropriate because:Hemodynamically unstable, Persistent severe electrolyte disturbances, Altered mental status, Ongoing diagnostic testing needed not appropriate for outpatient work up, Unsafe d/c plan, IV treatments appropriate due to intensity of illness or inability to take PO and Inpatient level of care appropriate due to severity of illness   Dispo: The patient is from: Home              Anticipated d/c is to: To be determined              Patient currently is not medically stable to d/c.   Difficult to place patient No       Consultants:  Interventional radiology PCCM   Sch Meds:  Scheduled Meds: . Chlorhexidine Gluconate  Cloth  6 each Topical Daily  . LORazepam  0-4 mg Intravenous Q6H   Or  . LORazepam  0-4 mg Oral Q6H  . [START ON 05/15/2020] LORazepam  0-4 mg Intravenous Q12H   Or  . [START ON 05/15/2020] LORazepam  0-4 mg Oral Q12H  . mouth rinse  15 mL Mouth Rinse BID  . potassium chloride  40 mEq Oral Once  . thiamine  100 mg Oral Daily   Or  . thiamine  100 mg Intravenous Daily   Continuous Infusions: . sodium chloride 250 mL (05/14/20 0543)  . ceFEPime (MAXIPIME) IV    . metronidazole 500 mg (05/14/20 0545)  . sodium chloride     PRN Meds:.sodium chloride  Antimicrobials: Anti-infectives (From admission, onward)   Start     Dose/Rate Route Frequency Ordered Stop   05/14/20 1400  ceFEPIme (MAXIPIME) 2 g in sodium chloride 0.9 % 100 mL IVPB        2 g 200 mL/hr over 30 Minutes Intravenous Every 8 hours 05/14/20 0919     05/13/20 1800  ceFEPIme (MAXIPIME) 2 g in sodium chloride 0.9 % 100 mL IVPB  Status:  Discontinued        2 g 200 mL/hr over 30  Minutes Intravenous Every 12 hours 05/13/20 1004 05/14/20 0919   05/13/20 1400  metroNIDAZOLE (FLAGYL) IVPB 500 mg        500 mg 100 mL/hr over 60 Minutes Intravenous Every 8 hours 05/13/20 1004     05/13/20 0530  ceFEPIme (MAXIPIME) 2 g in sodium chloride 0.9 % 100 mL IVPB        2 g 200 mL/hr over 30 Minutes Intravenous  Once 05/13/20 0524 05/13/20 0637   05/13/20 0530  metroNIDAZOLE (FLAGYL) IVPB 500 mg        500 mg 100 mL/hr over 60 Minutes Intravenous  Once 05/13/20 0524 05/13/20 0743   05/13/20 0530  vancomycin (VANCOCIN) IVPB 1000 mg/200 mL premix        1,000 mg 200 mL/hr over 60 Minutes Intravenous  Once 05/13/20 0524 05/13/20 0844       I have personally reviewed the following labs and images: CBC: Recent Labs  Lab 05/12/20 2336 05/14/20 0242  WBC 6.7 7.3  NEUTROABS 4.8  --   HGB 13.8 12.9*  HCT 39.9 36.7*  MCV 83.5 83.4  PLT 25* 32*   BMP &GFR Recent Labs  Lab 05/12/20 2336 05/13/20 1630 05/14/20 0242  05/14/20 0859  NA 139 141 143  --   K 3.1* 3.1* 3.4*  --   CL 105 109 113*  --   CO2 19* 19* 26  --   GLUCOSE 122* 92 83  --   BUN 61* 39* 35*  --   CREATININE 1.26* 0.85 0.88  --   CALCIUM 8.1* 7.8* 7.7*  --   MG  --  2.4  --  2.2   Estimated Creatinine Clearance: 66.1 mL/min (by C-G formula based on SCr of 0.88 mg/dL). Liver & Pancreas: Recent Labs  Lab 05/12/20 2336 05/13/20 1630 05/14/20 0242  AST 62* 55* 48*  ALT 44 36 32  ALKPHOS 123 103 88  BILITOT 4.8* 4.9* 5.0*  PROT 6.9 5.9* 5.3*  ALBUMIN 2.3* 2.0* 1.8*   No results for input(s): LIPASE, AMYLASE in the last 168 hours. Recent Labs  Lab 05/12/20 2336  AMMONIA 20   Diabetic: No results for input(s): HGBA1C in the last 72 hours. No results for input(s): GLUCAP in the last 168 hours. Cardiac Enzymes: Recent Labs  Lab 05/12/20 2336  CKTOTAL 54   No results for input(s): PROBNP in the last 8760 hours. Coagulation Profile: Recent Labs  Lab 05/12/20 2336  INR 1.5*   Thyroid Function Tests: No results for input(s): TSH, T4TOTAL, FREET4, T3FREE, THYROIDAB in the last 72 hours. Lipid Profile: No results for input(s): CHOL, HDL, LDLCALC, TRIG, CHOLHDL, LDLDIRECT in the last 72 hours. Anemia Panel: Recent Labs    05/13/20 1630  VITAMINB12 4,314*  FOLATE 3.7*   Urine analysis:    Component Value Date/Time   COLORURINE AMBER (A) 05/13/2020 0200   APPEARANCEUR CLEAR 05/13/2020 0200   LABSPEC 1.020 05/13/2020 0200   PHURINE 5.0 05/13/2020 0200   GLUCOSEU NEGATIVE 05/13/2020 0200   HGBUR MODERATE (A) 05/13/2020 0200   BILIRUBINUR NEGATIVE 05/13/2020 0200   KETONESUR NEGATIVE 05/13/2020 0200   PROTEINUR 30 (A) 05/13/2020 0200   UROBILINOGEN >8.0 (H) 05/06/2013 0954   NITRITE NEGATIVE 05/13/2020 0200   LEUKOCYTESUR NEGATIVE 05/13/2020 0200   Sepsis Labs: Invalid input(s): PROCALCITONIN, [place]  Microbiology: Recent Results (from the past 240 hour(s))  Resp Panel by RT-PCR (Flu A&B, Covid)  Nasopharyngeal Swab     Status: None   Collection Time: 05/12/20 11:36 PM  Specimen: Nasopharyngeal Swab; Nasopharyngeal(NP) swabs in vial transport medium  Result Value Ref Range Status   SARS Coronavirus 2 by RT PCR NEGATIVE NEGATIVE Final    Comment: (NOTE) SARS-CoV-2 target nucleic acids are NOT DETECTED.  The SARS-CoV-2 RNA is generally detectable in upper respiratory specimens during the acute phase of infection. The lowest concentration of SARS-CoV-2 viral copies this assay can detect is 138 copies/mL. A negative result does not preclude SARS-Cov-2 infection and should not be used as the sole basis for treatment or other patient management decisions. A negative result may occur with  improper specimen collection/handling, submission of specimen other than nasopharyngeal swab, presence of viral mutation(s) within the areas targeted by this assay, and inadequate number of viral copies(<138 copies/mL). A negative result must be combined with clinical observations, patient history, and epidemiological information. The expected result is Negative.  Fact Sheet for Patients:  EntrepreneurPulse.com.au  Fact Sheet for Healthcare Providers:  IncredibleEmployment.be  This test is no t yet approved or cleared by the [place] FDA and  has been authorized for detection and/or diagnosis of SARS-CoV-2 by FDA under an Emergency Use Authorization (EUA). This EUA will remain  in effect (meaning this test can be used) for the duration of the COVID-19 declaration under Section 564(b)(1) of the Act, 21 U.S.C.section 360bbb-3(b)(1), unless the authorization is terminated  or revoked sooner.       Influenza A by PCR NEGATIVE NEGATIVE Final   Influenza B by PCR NEGATIVE NEGATIVE Final    Comment: (NOTE) The Xpert Xpress SARS-CoV-2/FLU/RSV plus assay is intended as an aid in the diagnosis of influenza from Nasopharyngeal swab specimens and should not be  used as a sole basis for treatment. Nasal washings and aspirates are unacceptable for Xpert Xpress SARS-CoV-2/FLU/RSV testing.  Fact Sheet for Patients: EntrepreneurPulse.com.au  Fact Sheet for Healthcare Providers: IncredibleEmployment.be  This test is not yet approved or cleared by the [place] FDA and has been authorized for detection and/or diagnosis of SARS-CoV-2 by FDA under an Emergency Use Authorization (EUA). This EUA will remain in effect (meaning this test can be used) for the duration of the COVID-19 declaration under Section 564(b)(1) of the Act, 21 U.S.C. section 360bbb-3(b)(1), unless the authorization is terminated or revoked.  Performed at [facility], [place] [address] [place], [place] [address]   Urine Culture     Status: None   Collection Time: 05/13/20  2:00 AM   Specimen: Urine, Clean Catch  Result Value Ref Range Status   Specimen Description   Final    URINE, CLEAN CATCH Performed at [facility], [place] [address] [place], [place] [address]    Special Requests   Final    NONE Performed at [facility], [place] [address], [place], [place] [address]    Culture   Final    NO GROWTH Performed at [facility], [place] [address] [place], [place] [address]    Report Status 05/14/2020 FINAL  Final  Culture, blood (Routine X 2) w Reflex to ID Panel     Status: None (Preliminary result)   Collection Time: 05/13/20  5:53 AM   Specimen: BLOOD  Result Value Ref Range Status   Specimen Description   Final    BLOOD RIGHT ANTECUBITAL Performed at [place] [address] [place], [place] [address]    Special Requests   Final    BOTTLES DRAWN AEROBIC AND ANAEROBIC Blood Culture results may not be optimal  due to an inadequate volume of blood received in culture bottles Performed at [place] [address],  [place], [place] [address]    Culture   Final    NO GROWTH 1 DAY Performed at [facility], [place] [address] [place], [place] [address]    Report Status PENDING  Incomplete  Culture, blood (Routine X 2) w Reflex to ID Panel     Status: None (Preliminary result)   Collection Time: 05/13/20  5:53 AM   Specimen: BLOOD  Result Value Ref Range Status   Specimen Description   Final    BLOOD BLOOD RIGHT FOREARM Performed at [place] [address], [place], [place] [address]    Special Requests   Final    BOTTLES DRAWN AEROBIC AND ANAEROBIC Blood Culture results may not be optimal due to an inadequate volume of blood received in culture bottles Performed at [place] [address], [place], [place] [address]    Culture   Final    NO GROWTH 1 DAY Performed at [facility], [place] [address] [place], [place] [address]    Report Status PENDING  Incomplete  MRSA PCR Screening     Status: None   Collection Time: 05/13/20  9:34 AM   Specimen: Nasal Mucosa; Nasopharyngeal  Result Value Ref Range Status   MRSA by PCR NEGATIVE NEGATIVE Final    Comment:        The GeneXpert MRSA Assay (FDA approved for NASAL specimens only), is one component of a comprehensive MRSA colonization surveillance program. It is not intended to diagnose MRSA infection nor to guide or monitor treatment for MRSA infections. Performed at [facility], [place] [address] [place],  [address]     Radiology Studies: CT ABDOMEN PELVIS W CONTRAST  Result Date: 05/13/2020 CLINICAL DATA:  Abdominal pain, hepatitis C, cirrhosis EXAM: CT ABDOMEN AND PELVIS WITH CONTRAST TECHNIQUE: Multidetector CT imaging of the abdomen and pelvis was performed using the standard protocol following bolus administration of intravenous contrast. CONTRAST:  153mL OMNIPAQUE IOHEXOL 300 MG/ML  SOLN COMPARISON:  11/10/2015 FINDINGS: Lower chest: Patchy consolidation within  the left lower lobe may reflect hypoventilatory change, airspace disease, or even aspiration. No effusion or pneumothorax. Hepatobiliary: The liver is cirrhotic. A large infiltrating mass is seen throughout the right lobe of the liver, measuring up to 7.7 x 5.7 by 10.0 cm. Multiple central areas of necrosis are identified. This mass demonstrates heterogeneous enhancement, with areas of delayed washout and delayed enhancement identified especially along the inferior margin of this mass. There are areas of intrahepatic biliary dilation, as well as tumor thrombus extending into the middle hepatic vein. Overall, imaging characteristics are consistent with cholangiocarcinoma or biphenotypic hepatocellular carcinoma. There are calcified gallstones dependently within the gallbladder. Mild diffuse nonspecific gallbladder wall thickening is noted. No evidence of common bile duct dilation. Pancreas: Unremarkable. No pancreatic ductal dilatation or surrounding inflammatory changes. Spleen: Normal in size without focal abnormality. Adrenals/Urinary Tract: Adrenal glands are unremarkable. Kidneys are normal, without renal calculi, focal lesion, or hydronephrosis. Bladder is unremarkable. Stomach/Bowel: No bowel obstruction or ileus. No bowel wall thickening or inflammatory change. Vascular/Lymphatic: There is significant eccentric posterior atheromatous plaque within the abdominal aorta just inferior to the level of the renal arteries. This plaque extends approximately 3.5 cm in length, narrows the lumen of the aorta by approximately 50%. Similar atheromatous plaque is seen at the origin of the right common iliac  artery with estimated 50% stenosis. Diffuse atherosclerosis throughout the remainder of the aorta and its branches. 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 Remaining venous structures are unremarkable. No pathologic adenopathy within the abdomen or pelvis. Reproductive: Prostate is unremarkable.  Other: Trace ascites within the right upper quadrant. No free intraperitoneal gas. No abdominal wall hernia. Musculoskeletal: No acute or destructive bony lesions. There is grade 1 anterolisthesis of L3 on L4, with bilateral L3 spondylolysis. Moderate spondylosis at the L3-4 level. Reconstructed images demonstrate no additional findings. IMPRESSION: 1. 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 This mass is likely amenable to ultrasound-guided biopsy. 2. Patchy consolidation left lower lobe favor airspace disease over atelectasis. Aspiration could be considered in the appropriate setting. 3. Aortic Atherosclerosis (ICD10-I70.0). Prominent eccentric atheromatous plaque within the mid abdominal aorta and at the origin of the right common iliac artery, with approximately 50% stenosis at each location. 4. Cholelithiasis, with mild nonspecific gallbladder wall thickening felt to be due to trace ascites and hypoproteinemic state given background hepatic cirrhosis. Electronically Signed   By: Randa Ngo M.D.   On: 05/13/2020 21:24   US RENAL  Result Date: 05/13/2020 CLINICAL DATA:  Acute kidney injury. EXAM: RENAL / URINARY TRACT ULTRASOUND COMPLETE COMPARISON:  None. FINDINGS: Right Kidney: Renal measurements: 12.4 x 5.2 x 9.2 cm = volume: 242 mL. Echogenicity within normal limits. No mass or hydronephrosis visualized. Left Kidney: Renal measurements: 11.2 x 6.2 x 5.9 cm = volume: 214 mL. Echogenicity within normal limits. No mass or hydronephrosis visualized. Bladder: Appears normal for degree of bladder distention. No ureteral jets are noted. Other: None. IMPRESSION: Normal renal ultrasound. Electronically Signed   By: Marijo Conception M.D.   On: 05/13/2020 16:38   DG Chest Port 1  View  Result Date: 05/14/2020 CLINICAL DATA:  Hypertension, cirrhosis, hepatitis-C. Altered mental status. EXAM: PORTABLE CHEST 1 VIEW COMPARISON:  05/12/2020 FINDINGS: Increasing airspace opacity in the left lower lung concerning for early infiltrate. Right lung clear. Heart is normal size. No effusions. Mild hyperinflation, stable. No acute bony abnormality. IMPRESSION: Stable hyperinflation. Increasing vague opacity in the left lower lung concerning for early infiltrate/pneumonia. Electronically Signed   By: Rolm Baptise M.D.   On: 05/14/2020 09:14      Taye T. [place]  If 7PM-7AM, please contact night-coverage [email or web address] 05/14/2020, 11:45 AM

## 2020-05-14 NOTE — Consult Note (Addendum)
NAME:  Shawn Nguyen, MRN:  [id number], DOB:  12/12/1955, LOS: 1 ADMISSION DATE:  05/12/2020, CONSULTATION DATE:  3/17 REFERRING MD:  Cyndia Skeeters, CHIEF COMPLAINT:  Acute metabolic encephalopathy and concern for airway protection    History of Present Illness:  65 year old male with history as mentioned below presented to the emergency room on 3/15 after being found by his neighbors hanging on from his couch confused and disheveled.  UDS was positive for THC.  Initial lactate 4.8.  Started on CIWA protocol.  Given IV fluid challenge for elevated lactate.  He had a CT abdomen pelvis obtained to rule out any sort of abdominal necrosis.  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  On 3/17 pulmonary asked to evaluate as patient remained lethargic/somnolent, intermittently hypotensive with systolic blood pressure in the 80s, and internal medicine service concern about airway protection Pertinent  Medical History  ETOH abuse, hep C, Cirrhosis, esophageal varices, HTN, ascites   Significant Hospital Events: Including procedures, antibiotic start and stop dates in addition to other pertinent events   . 3/15 admitted. Initial Lactate 4.8, scr 1.26, IV bolus administered. started on cefepime and flagyl. Cultures sent  . 3/16 lactate still elevated. CT abd pelvis obtained. Showed large infiltrated mass in R hepatic lobe concerning for [facility] w/ tumor thrombus in the hepatic Vein. There was also chololithiasis w/ non-specific GB wall thickening. Tace ascites. Aortic stenosis. LLL airspace disease. PCT 4.88/ lactic acid as high as 7.2 got more fluid . Intermittent hypotension. PCT trending down. Lactate 1.1 renal now nml. PCCM asked to see for borderline BP and encephalopathy w/ concern for airway protection   Interim History / Subjective:    Objective   Blood pressure 107/66, pulse 80, temperature (Abnormal) 96.9 F (36.1 C), temperature source Axillary, resp. rate  (Abnormal) 23, height 6' (1.829 m), weight 54.4 kg, SpO2 98 %.        Intake/Output Summary (Last 24 hours) at 05/14/2020 1223 Last data filed at 05/14/2020 0600 Gross per 24 hour  Intake 4237.21 ml  Output 475 ml  Net 3762.21 ml   Filed Weights   05/12/20 2322  Weight: 54.4 kg    Examination: General: frail deconditioned and malnourished 65 year old male. Lethargic but interactive w/ stimulation   HENT: poor dentition. MM dry and cracked w/ dried blood on lower lip no JVD, temporal wasting. Sclera not icteric  Lungs: bilateral rhonchi no accessory use now on 2 lpm Cardiovascular: RRR  Abdomen: not tender + bowel sounds  Extremities: trace LE edema pulse positive  Neuro: opens eyes. Tracks will f/c sp slurred at best  GU: condom cath   Labs/imaging personally reviewed   See below   Resolved Hospital Problem list   AKI (resolved as of 3/17) Lactic acidosis (resolved as of 3/17)  Assessment & Plan:   Acute metabolic/hepatic encephalopathy. Multifactorial. Acute hepatic encephalopathy, AKI, possible infection and renal dsyfxn. Was placed on CIWA protocol but given his Cirrhosis not a good candidate for benzodiazepines Plan Dc CIWA protocol Cont thiamine and folate  If were to get agitated we could consider precedex but would try to avoid as it would likely make his hypotension worse.  Place PANDA tube and start Lactulose   Possible sepsis. Source not clear. Does have LLL infiltrate of CT abd and given his encephalopathy this could reflect an aspiration event Plan Day 3 cefepime and flagyl F/u cultures Trend PCT  Hypotension. Intermittent. Now 6  liters positive -prob still mix of relative hypotension and third spacing and possibly exacerbated by benzos. 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 Has associated abnormal LFTs and increased bili  This may be a large contributing factor to his encephalopathy Plan Supportive care Lactulose as above IR consulted for IR scheduled bx  Consider GI consult Also need to get palliative as he is clearly malnourished and seems to have sig evidence of FTT   Fluid and electrolyte imbalance: Hyperchloremia (2/2 NaCL replacement) Plan Cont LR  Am chem   Severe thrombocytopenia. Prob mix of cirrhosis and possibly infection. Plan Trend CBC Careful w/ procedures Transfuse for plts less than 10 K  Mild coagulopathy Plan Vit K for INR > 1.5 as needed  Protein calorie malnutrition Plan Placing FT. Could start tubefeeds.   Aortic stenosis Plan NTD.   Poor dentition.  Plan Needs multiple teeth extracted but suspect bleeding risk pretty high      Best practice (evaluated daily)  Diet:  NPO Pain/Anxiety/Delirium protocol (if indicated): No VAP protocol (if indicated): Not indicated DVT prophylaxis: SCD GI prophylaxis: N/A Glucose control:  SSI No Central venous access:  N/A Arterial line:  N/A Foley:  N/A Mobility:  bed rest  PT consulted: N/A Last date of multidisciplinary goals of care discussion [pending] Code Status:  full code Disposition: ICU/stepdown   Labs   CBC: Recent Labs  Lab 05/12/20 2336 05/14/20 0242  WBC 6.7 7.3  NEUTROABS 4.8  --   HGB 13.8 12.9*  HCT 39.9 36.7*  MCV 83.5 83.4  PLT 25* 32*    Basic Metabolic Panel: Recent Labs  Lab 05/12/20 2336 05/13/20 1630 05/14/20 0242 05/14/20 0859  NA 139 141 143  --   K 3.1* 3.1* 3.4*  --   CL 105 109 113*  --   CO2 19* 19* 26  --   GLUCOSE 122* 92 83  --   BUN 61* 39* 35*  --   CREATININE 1.26* 0.85 0.88  --   CALCIUM 8.1* 7.8* 7.7*  --   MG  --  2.4  --  2.2   GFR: Estimated Creatinine Clearance: 66.1 mL/min (by C-G formula based on SCr of 0.88 mg/dL). Recent Labs  Lab  05/12/20 2336 05/13/20 0419 05/13/20 0655 05/13/20 1007 05/13/20 1630 05/14/20 0242 05/14/20 0859  PROCALCITON  --   --   --   --  4.88 3.68  --   WBC 6.7  --   --   --   --  7.3  --   LATICACIDVEN 4.8* 5.1* 4.5* 7.2*  --   --  1.1    Liver Function Tests: Recent Labs  Lab 05/12/20 2336 05/13/20 1630 05/14/20 0242  AST 62* 55* 48*  ALT 44 36 32  ALKPHOS 123 103 88  BILITOT 4.8* 4.9* 5.0*  PROT 6.9 5.9* 5.3*  ALBUMIN 2.3* 2.0* 1.8*   No results for input(s): LIPASE, AMYLASE in the last 168 hours. Recent Labs  Lab 05/12/20 2336  AMMONIA 20    ABG No results found for: PHART, PCO2ART, PO2ART, HCO3, TCO2, ACIDBASEDEF, O2SAT   Coagulation Profile: Recent Labs  Lab 05/12/20 2336  INR 1.5*    Cardiac Enzymes: Recent Labs  Lab 05/12/20 2336  CKTOTAL 54    HbA1C: Hemoglobin A1C  Date/Time Value Ref Range Status  05/18/2013 10:38 AM 5.0  Final    CBG: No results for input(s): GLUCAP in the last 168 hours.  Review of Systems:   Not able  Past Medical History:  He,  has a past medical history of Alcoholism ([place]), Ascites (03/2013), Cirrhosis ([place]) (06/14/2013), Esophageal varices ([place]) (12/25/2013), Gallstones (03/2013), Hepatitis C, HTN (hypertension), Rectal varices - small (03/02/7251), and Umbilical hernia (07/6438).   Surgical History:   Past Surgical History:  Procedure Laterality Date  . COLONOSCOPY N/A 12/25/2013   Procedure: COLONOSCOPY;  Surgeon: Gatha Mayer, MD;  Location: [facility] ENDOSCOPY;  Service: Endoscopy;  Laterality: N/A;  . ESOPHAGOGASTRODUODENOSCOPY N/A 12/25/2013   Procedure: ESOPHAGOGASTRODUODENOSCOPY (EGD);  Surgeon: Gatha Mayer, MD;  Location: [facility];  Service: Endoscopy;  Laterality: N/A;  . ESOPHAGOGASTRODUODENOSCOPY (EGD) WITH PROPOFOL N/A 03/16/2015   Procedure: ESOPHAGOGASTRODUODENOSCOPY (EGD) WITH PROPOFOL;  Surgeon: Gatha Mayer, MD;  Location: [facility] ENDOSCOPY;  Service: Endoscopy;  Laterality: N/A;     Social History:    reports that he has been smoking cigarettes. He has been smoking about 0.25 packs per day. He has never used smokeless tobacco. He reports current alcohol use. He reports that he does not use drugs.   Family History:  His family history includes Diabetes in his father.   Allergies No Known Allergies   Home Medications  Prior to Admission medications   Medication Sig Start Date End Date Taking? Authorizing Provider  furosemide (LASIX) 40 MG tablet Take 1 tablet (40 mg total) by mouth daily. Patient not taking: Reported on 05/13/2020 01/27/15   Gatha Mayer, MD  omeprazole (PRILOSEC) 20 MG capsule Take 1 capsule (20 mg total) by mouth daily before breakfast. Patient not taking: Reported on 05/13/2020 01/27/15   Gatha Mayer, MD  ribavirin (REBETOL) 200 MG capsule Take 3 capsules (600 mg total) by mouth 2 (two) times daily. Patient not taking: Reported on 05/13/2020 06/30/15   Thayer Headings, MD  Sofosbuvir-Velpatasvir (EPCLUSA) 400-100 MG TABS Take 1 tablet by mouth daily. Patient not taking: Reported on 05/13/2020 06/30/15   Thayer Headings, MD  spironolactone (ALDACTONE) 25 MG tablet Take 2 tablets (50 mg total) by mouth daily. Patient not taking: Reported on 05/13/2020 01/27/15   Gatha Mayer, MD  potassium chloride SA (K-DUR,KLOR-CON) 20 MEQ tablet Take 1 tablet (20 mEq total) by mouth daily. 06/14/13 10/02/13  Lorayne Marek, MD     Critical care time: 33 min     Erick Colace ACNP-BC [facility] Pager # [phone] OR # [phone] if no answer

## 2020-05-15 ENCOUNTER — Inpatient Hospital Stay (HOSPITAL_COMMUNITY): Payer: Medicaid Other

## 2020-05-15 DIAGNOSIS — Z66 Do not resuscitate: Secondary | ICD-10-CM

## 2020-05-15 DIAGNOSIS — G9341 Metabolic encephalopathy: Secondary | ICD-10-CM | POA: Diagnosis not present

## 2020-05-15 DIAGNOSIS — I9589 Other hypotension: Secondary | ICD-10-CM

## 2020-05-15 DIAGNOSIS — Z7189 Other specified counseling: Secondary | ICD-10-CM

## 2020-05-15 DIAGNOSIS — G9340 Encephalopathy, unspecified: Secondary | ICD-10-CM | POA: Diagnosis not present

## 2020-05-15 DIAGNOSIS — Z515 Encounter for palliative care: Secondary | ICD-10-CM

## 2020-05-15 LAB — CBC
HCT: 36.7 % — ABNORMAL LOW (ref 39.0–52.0)
Hemoglobin: 12.1 g/dL — ABNORMAL LOW (ref 13.0–17.0)
MCH: 28.8 pg (ref 26.0–34.0)
MCHC: 33 g/dL (ref 30.0–36.0)
MCV: 87.4 fL (ref 80.0–100.0)
Platelets: 28 10*3/uL — CL (ref 150–400)
RBC: 4.2 MIL/uL — ABNORMAL LOW (ref 4.22–5.81)
RDW: 16.8 % — ABNORMAL HIGH (ref 11.5–15.5)
WBC: 8 10*3/uL (ref 4.0–10.5)
nRBC: 1.3 % — ABNORMAL HIGH (ref 0.0–0.2)

## 2020-05-15 LAB — COMPREHENSIVE METABOLIC PANEL
ALT: 31 U/L (ref 0–44)
ALT: 29 U/L (ref 0–44)
AST: 58 U/L — ABNORMAL HIGH (ref 15–41)
AST: 53 U/L — ABNORMAL HIGH (ref 15–41)
Albumin: 1.7 g/dL — ABNORMAL LOW (ref 3.5–5.0)
Albumin: 1.7 g/dL — ABNORMAL LOW (ref 3.5–5.0)
Alkaline Phosphatase: 81 U/L (ref 38–126)
Alkaline Phosphatase: 81 U/L (ref 38–126)
Anion gap: 10 (ref 5–15)
Anion gap: 8 (ref 5–15)
BUN: 25 mg/dL — ABNORMAL HIGH (ref 8–23)
BUN: 23 mg/dL (ref 8–23)
CO2: 18 mmol/L — ABNORMAL LOW (ref 22–32)
CO2: 20 mmol/L — ABNORMAL LOW (ref 22–32)
Calcium: 7.4 mg/dL — ABNORMAL LOW (ref 8.9–10.3)
Calcium: 7.6 mg/dL — ABNORMAL LOW (ref 8.9–10.3)
Chloride: 116 mmol/L — ABNORMAL HIGH (ref 98–111)
Chloride: 116 mmol/L — ABNORMAL HIGH (ref 98–111)
Creatinine, Ser: 0.75 mg/dL (ref 0.61–1.24)
Creatinine, Ser: 0.73 mg/dL (ref 0.61–1.24)
GFR, Estimated: >60 mL/min (ref >60)
GFR, Estimated: >60 mL/min (ref >60)
Glucose, Bld: 68 mg/dL — ABNORMAL LOW (ref 70–99)
Glucose, Bld: 78 mg/dL (ref 70–99)
Potassium: 4.3 mmol/L (ref 3.5–5.1)
Potassium: 3.7 mmol/L (ref 3.5–5.1)
Sodium: 144 mmol/L (ref 135–145)
Sodium: 144 mmol/L (ref 135–145)
Total Bilirubin: 5.1 mg/dL — ABNORMAL HIGH (ref 0.3–1.2)
Total Bilirubin: 5.2 mg/dL — ABNORMAL HIGH (ref 0.3–1.2)
Total Protein: 4.9 g/dL — ABNORMAL LOW (ref 6.5–8.1)
Total Protein: 5 g/dL — ABNORMAL LOW (ref 6.5–8.1)

## 2020-05-15 LAB — ECHOCARDIOGRAM COMPLETE
AR max vel: 1.91 cm2
AV Area VTI: 1.97 cm2
AV Area mean vel: 1.84 cm2
AV Mean grad: 5 mmHg
AV Peak grad: 9 mmHg
Ao pk vel: 1.5 m/s
Area-P 1/2: 2.87 cm2
Height: 72 in
P 1/2 time: 362 msec
S' Lateral: 2.9 cm
Weight: 1920 oz

## 2020-05-15 LAB — PROCALCITONIN: Procalcitonin: 2.32 ng/mL

## 2020-05-15 LAB — PHOSPHORUS
Phosphorus: 2.3 mg/dL — ABNORMAL LOW (ref 2.5–4.6)
Phosphorus: 2.4 mg/dL — ABNORMAL LOW (ref 2.5–4.6)

## 2020-05-15 LAB — AMMONIA: Ammonia: 16 umol/L (ref 9–35)

## 2020-05-15 LAB — RPR: RPR Ser Ql: NONREACTIVE

## 2020-05-15 LAB — MAGNESIUM
Magnesium: 2 mg/dL (ref 1.7–2.4)
Magnesium: 2 mg/dL (ref 1.7–2.4)

## 2020-05-15 LAB — GLUCOSE, CAPILLARY: Glucose-Capillary: 77 mg/dL (ref 70–99)

## 2020-05-15 LAB — LIPASE, BLOOD: Lipase: 30 U/L (ref 11–51)

## 2020-05-15 MED ORDER — ADULT MULTIVITAMIN LIQUID CH
15.0000 mL | Freq: Every day | ORAL | Status: DC
  Administered 2020-05-15: 15 mL
  Filled 2020-05-15: qty 15

## 2020-05-15 MED ORDER — ONDANSETRON 4 MG PO TBDP: 4.0000 mg | ORAL_TABLET | Freq: Four times a day (QID) | ORAL | Status: DC | PRN

## 2020-05-15 MED ORDER — LACTULOSE 10 GM/15ML PO SOLN
20.0000 g | Freq: Three times a day (TID) | ORAL | Status: DC
  Administered 2020-05-15: 20 g
  Filled 2020-05-15: qty 30

## 2020-05-15 MED ORDER — DEXTROSE IN LACTATED RINGERS 5 % IV SOLN: INTRAVENOUS | Status: DC

## 2020-05-15 MED ORDER — LORAZEPAM 2 MG/ML IJ SOLN
1.0000 mg | INTRAMUSCULAR | Status: DC | PRN
  Administered 2020-05-15 – 2020-05-16 (×2): 1 mg via INTRAVENOUS
  Filled 2020-05-15 (×2): qty 1

## 2020-05-15 MED ORDER — ORAL CARE MOUTH RINSE
15.0000 mL | Freq: Two times a day (BID) | OROMUCOSAL | Status: DC
  Administered 2020-05-15: 15 mL via OROMUCOSAL

## 2020-05-15 MED ORDER — MORPHINE SULFATE (PF) 2 MG/ML IV SOLN
2.0000 mg | INTRAVENOUS | Status: DC | PRN
Start: 2020-05-15 — End: 2020-05-16
  Administered 2020-05-15 – 2020-05-16 (×7): 2 mg via INTRAVENOUS
  Filled 2020-05-15 (×7): qty 1

## 2020-05-15 MED ORDER — LIDOCAINE-EPINEPHRINE (PF) 2 %-1:200000 IJ SOLN
INTRAMUSCULAR | Status: AC
  Filled 2020-05-15: qty 20

## 2020-05-15 MED ORDER — LORAZEPAM 2 MG/ML PO CONC: 1.0000 mg | ORAL | Status: DC | PRN

## 2020-05-15 MED ORDER — LORAZEPAM 2 MG/ML IJ SOLN: 0.5000 mg | INTRAMUSCULAR | Status: DC | PRN

## 2020-05-15 MED ORDER — FOLIC ACID 1 MG PO TABS
1.0000 mg | ORAL_TABLET | Freq: Every day | ORAL | Status: DC
  Administered 2020-05-15: 1 mg
  Filled 2020-05-15: qty 1

## 2020-05-15 MED ORDER — ONDANSETRON HCL 4 MG/2ML IJ SOLN: 4.0000 mg | Freq: Four times a day (QID) | INTRAMUSCULAR | Status: DC | PRN

## 2020-05-15 MED ORDER — ACETAMINOPHEN 160 MG/5ML PO SOLN
650.0000 mg | Freq: Three times a day (TID) | ORAL | Status: DC | PRN
  Administered 2020-05-15: 650 mg
  Filled 2020-05-15: qty 20.3

## 2020-05-15 MED ORDER — GLYCOPYRROLATE 0.2 MG/ML IJ SOLN: 0.2000 mg | INTRAMUSCULAR | Status: DC | PRN

## 2020-05-15 MED ORDER — HALOPERIDOL LACTATE 2 MG/ML PO CONC: 0.5000 mg | ORAL | Status: DC | PRN

## 2020-05-15 MED ORDER — MORPHINE SULFATE (PF) 2 MG/ML IV SOLN
2.0000 mg | Freq: Once | INTRAVENOUS | Status: AC
  Administered 2020-05-15: 2 mg via INTRAVENOUS
  Filled 2020-05-15: qty 1

## 2020-05-15 MED ORDER — CHLORHEXIDINE GLUCONATE 0.12 % MT SOLN
15.0000 mL | Freq: Two times a day (BID) | OROMUCOSAL | Status: DC
  Administered 2020-05-15: 15 mL via OROMUCOSAL
  Filled 2020-05-15: qty 15

## 2020-05-15 MED ORDER — LORAZEPAM 1 MG PO TABS: 1.0000 mg | ORAL_TABLET | ORAL | Status: DC | PRN

## 2020-05-15 MED ORDER — LACTULOSE 10 GM/15ML PO SOLN: 20.0000 g | ORAL | Status: DC

## 2020-05-15 MED ORDER — MIDODRINE HCL 5 MG PO TABS
5.0000 mg | ORAL_TABLET | Freq: Three times a day (TID) | ORAL | Status: DC
  Administered 2020-05-15: 5 mg
  Filled 2020-05-15: qty 1

## 2020-05-15 MED ORDER — MORPHINE SULFATE (PF) 2 MG/ML IV SOLN
1.0000 mg | INTRAVENOUS | Status: DC | PRN
  Administered 2020-05-15: 1 mg via INTRAVENOUS
  Filled 2020-05-15: qty 1

## 2020-05-15 MED ORDER — POLYVINYL ALCOHOL 1.4 % OP SOLN
1.0000 [drp] | Freq: Four times a day (QID) | OPHTHALMIC | Status: DC | PRN
  Filled 2020-05-15: qty 15

## 2020-05-15 MED ORDER — GLYCOPYRROLATE 0.2 MG/ML IJ SOLN
0.2000 mg | INTRAMUSCULAR | Status: DC | PRN
  Administered 2020-05-15 – 2020-05-16 (×2): 0.2 mg via INTRAVENOUS
  Filled 2020-05-15 (×2): qty 1

## 2020-05-15 MED ORDER — HALOPERIDOL LACTATE 5 MG/ML IJ SOLN: 0.5000 mg | INTRAMUSCULAR | Status: DC | PRN

## 2020-05-15 MED ORDER — HALOPERIDOL 0.5 MG PO TABS: 0.5000 mg | ORAL_TABLET | ORAL | Status: DC | PRN

## 2020-05-15 MED ORDER — GLYCOPYRROLATE 1 MG PO TABS: 1.0000 mg | ORAL_TABLET | ORAL | Status: DC | PRN

## 2020-05-15 NOTE — Progress Notes (Signed)
Patient's sister requesting patient's body to be donated to science to [place] [phone]. [phone], patient's sister states this was one of patient's wishes -- unable to obtain more information for this process at this time. Will pass along information to receiving unit, patient transferring out of ICU/SD.

## 2020-05-15 NOTE — Progress Notes (Signed)
  Echocardiogram 2D Echocardiogram has been performed.  Aurore Redinger G Domenique Southers 05/15/2020, 12:30 PM

## 2020-05-15 NOTE — Progress Notes (Signed)
Nutrition Brief Note  Initial RD assessment completed remotely by this RD yesterday (3/17). Consult received late morning today for TF initiation and management. Chart reviewed. Patient now transitioning to comfort care with plans for d/c to residential hospice if patient is stable enough for transport.    Patient is NPO and noted to be disoriented x4. No further nutrition interventions warranted at this time. Please re-consult as needed.       Shawn Matin, MS, RD, LDN, CNSC Inpatient Clinical Dietitian RD pager # available in [place]  After hours/weekend pager # available in [facility]

## 2020-05-15 NOTE — Progress Notes (Signed)
PROGRESS NOTE  Shawn Nguyen MPN:[id number] DOB: [birth year]   PCP: Tresa Garter, MD  Patient is from: Home.  Lives alone.  DOA: 05/12/2020 LOS: 2  Chief complaints: Altered mental status  Brief Narrative / Interim history: 65 year old male with history of EtOH abuse, hepatitis C, cirrhosis, esophageal varices and HTN brought to ED by EMS after found hanging of his [place] by his neighbor who called EMS.   In ED, afebrile.  Hemodynamically stable.  Saturating in upper 90s on RA.  WBC 6.7.  Platelets 25.  K3.1.  Bicarb 19. Cr 1.26.  BUN 61.  AST 62.  Total bili 4.8.  Direct 2.5.  CK 54.  Troponin 39> 47.  Lactic acid 4.8>>5.1.  CXR without acute finding.  CT H/C-spine without acute finding.  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  Cultures obtained.  Patient was started on IV fluid and broad-spectrum antibiotics, and admitted for acute encephalopathy and AKI.    The next day, remained encephalopathic and hypotensive despite multiple IV boluses..  Basic encephalopathy work-up unrevealing.  EEG with moderate diffuse encephalopathy but no seizure or epileptiform discharge.  PCCM consulted and started on lactulose and midodrine via NG tube and recommended palliative medicine consult.  IR consulted for liver biopsy.   On 05/15/2020, extensive discussion about the patient's condition and poor prognosis with patient's sister, Jhonnie Aliano who the only close family member alive. She says, she hasn't seen her brother for over a year but has been talking to him over the phone. She says she couldn't visit him because, he never told her his exact address.  We discussed CODE STATUS and the pros and cons of resuscitation.  I recommended DNR/DNI which she agreed to.  We also discussed other scopes of care including comfort measures.  I do not believe  current treatment is going to reverse the situation.  I recommended keeping him as comfortable and die peacefully when it is time.  Margaret appreciated the information and agreed to full comfort measures.  She also appreciated the effort by the medical team to treat her brother but understands that he is very ill.   Subjective: Seen and examined earlier this morning.  No major events overnight of this morning.  He remains somnolent.  Barely opens his eyes and moans to physical stimulation.   Objective: Vitals:   05/15/20 0900 05/15/20 1000 05/15/20 1100 05/15/20 1200  BP: 113/79 121/63 112/75   Pulse:      Resp: 13 15 20    Temp:    (!) 96.8 F (36 C)  TempSrc:    Axillary  SpO2:      Weight:      Height:        Intake/Output Summary (Last 24 hours) at 05/15/2020 1226 Last data filed at 05/15/2020 0700 Gross per 24 hour  Intake 1293.67 ml  Output 1450 ml  Net -156.33 ml   Filed Weights   05/12/20 2322  Weight: 54.4 kg    Examination:   GENERAL: Frail and chronically ill-appearing. HEENT: Poor dentition. NECK: Supple.  No apparent JVD.  RESP: On RA.  No IWOB.  Fair aeration bilaterally. CVS:  RRR. Heart sounds normal.  ABD/GI/GU: BS+. Abd soft.  Diffuse tenderness. MSK/EXT: No apparent deformity.  Muscle mass and subcu fat loss. SKIN: no apparent skin lesion or wound NEURO: Somnolent.  Barely opens his eyes  in response to physical stimulation.  No apparent focal neuro deficit but limited exam PSYCH: No distress or agitation.  Procedures:  None  Microbiology summarized: [id number] and influenza PCR nonreactive. MRSA PCR screening negative. Blood cultures NGTD. Urine cultures NGTD.  Assessment & Plan: End-of-life care/DNR/DNI -Changed to full comfort care after discussion with patient's sister over the phone. -Palliative focused order set with as needed medications -TOC consult for residential hospice bed  Acute metabolic encephalopathy-unclear etiology but  concern for aspiration pneumonia and EtOH abuse.  He received 2 doses of Ativan.  Last dose was about midnight.  Work-up including ammonia, B12, CTH/C-spine without significant finding.  UDS positive for marijuana.  EEG with encephalopathy.  Remains encephalopathic  Aspiration pneumonia: CT abdomen and pelvis revealed LLL consolidation concerning for aspiration pneumonia given patient's mental status and respiratory secretions.  Procalcitonin elevated.  Cultures negative.  CCM signed off.  Liver cirrhosis/liver mass-CT abdomen and pelvis revealed 7.7 x 5.7 by 10.0 cm mass concerning for malignancy given patient's history of liver cirrhosis and hep C  AKI/azotemia: Likely due to dehydration.  Appears dry on exam.  AKI resolved.  Elevated liver enzymes/hyperbilirubinemia: Liver enzymes improved.  Total bili remains elevated.  Elevated troponin: Likely demand ischemia and delayed clearance.  Lactic acidosis: could be due to infection (pneumonia) and dehydration.  Resolved.  Hypokalemia: Magnesium was normal.  Tumor thrombus of hepatic vein  History of hepatitis C-seems to be treated for this in 2017  History of alcohol abuse: No significant withdrawal symptoms.  Thrombocytopenia: Likely due to liver cirrhosis from alcohol.   Coagulopathy: Likely due to liver failure.  Poor dentition  Aortic atherosclerosis  Severe malnutrition: as evidenced by significant muscle mass and subcu fat loss a low albumin level Body mass index is 16.27 kg/m. Nutrition Problem: Inadequate oral intake Etiology: inability to eat Signs/Symptoms: NPO status Interventions: Refer to RD note for recommendations   DVT prophylaxis:  SCDs Start: 05/13/20 1548  Code Status: DNR/DNI Family Communication: Updated patient's sister over the phone. Level of care: Stepdown Status is: Inpatient  Remains inpatient appropriate because:Unsafe d/c plan   Dispo: The patient is from: Home              Anticipated d/c  is to: Residential hospice if remains stable for transfer.   Difficult to place patient No       Consultants:  Interventional radiology PCCM-signed off   Sch Meds:  Scheduled Meds: . chlorhexidine  15 mL Mouth Rinse BID  . Chlorhexidine Gluconate Cloth  6 each Topical Daily  . folic acid  1 mg Per Tube Daily  . lactulose  20 g Per Tube TID  . mouth rinse  15 mL Mouth Rinse q12n4p  . midodrine  5 mg Per Tube TID WC  . multivitamin  15 mL Per Tube Daily  . [START ON 05/21/2020] thiamine injection  100 mg Intravenous Daily   Continuous Infusions: . sodium chloride Stopped (05/14/20 2243)  . ceFEPime (MAXIPIME) IV Stopped (05/15/20 0556)  . dextrose 5% lactated ringers 50 mL/hr at 05/15/20 1055  . metronidazole Stopped (05/15/20 8921)  . thiamine injection Stopped (05/15/20 0600)   Followed by  . [START ON 05/16/2020] thiamine injection     PRN Meds:.sodium chloride, acetaminophen (TYLENOL) oral liquid 160 mg/5 mL, LORazepam  Antimicrobials: Anti-infectives (From admission, onward)   Start     Dose/Rate Route Frequency Ordered Stop   05/14/20 1400  ceFEPIme (MAXIPIME) 2 g in sodium chloride 0.9 % 100 mL IVPB  2 g 200 mL/hr over 30 Minutes Intravenous Every 8 hours 05/14/20 0919     05/13/20 1800  ceFEPIme (MAXIPIME) 2 g in sodium chloride 0.9 % 100 mL IVPB  Status:  Discontinued        2 g 200 mL/hr over 30 Minutes Intravenous Every 12 hours 05/13/20 1004 05/14/20 0919   05/13/20 1400  metroNIDAZOLE (FLAGYL) IVPB 500 mg        500 mg 100 mL/hr over 60 Minutes Intravenous Every 8 hours 05/13/20 1004     05/13/20 0530  ceFEPIme (MAXIPIME) 2 g in sodium chloride 0.9 % 100 mL IVPB        2 g 200 mL/hr over 30 Minutes Intravenous  Once 05/13/20 0524 05/13/20 0637   05/13/20 0530  metroNIDAZOLE (FLAGYL) IVPB 500 mg        500 mg 100 mL/hr over 60 Minutes Intravenous  Once 05/13/20 0524 05/13/20 0743   05/13/20 0530  vancomycin (VANCOCIN) IVPB 1000 mg/200 mL premix         1,000 mg 200 mL/hr over 60 Minutes Intravenous  Once 05/13/20 0524 05/13/20 0844       I have personally reviewed the following labs and images: CBC: Recent Labs  Lab 05/12/20 2336 05/14/20 0242 05/15/20 0249  WBC 6.7 7.3 8.0  NEUTROABS 4.8  --   --   HGB 13.8 12.9* 12.1*  HCT 39.9 36.7* 36.7*  MCV 83.5 83.4 87.4  PLT 25* 32* 28*   BMP &GFR Recent Labs  Lab 05/12/20 2336 05/13/20 1630 05/14/20 0242 05/14/20 0859 05/14/20 2231 05/15/20 0249  NA 139 141 143  --  144 144  K 3.1* 3.1* 3.4*  --  4.3 3.7  CL 105 109 113*  --  116* 116*  CO2 19* 19* 26  --  18* 20*  GLUCOSE 122* 92 83  --  68* 78  BUN 61* 39* 35*  --  25* 23  CREATININE 1.26* 0.85 0.88  --  0.75 0.73  CALCIUM 8.1* 7.8* 7.7*  --  7.4* 7.6*  MG  --  2.4  --  2.2 2.0 2.0  PHOS  --   --   --   --  2.3* 2.4*   Estimated Creatinine Clearance: 72.7 mL/min (by C-G formula based on SCr of 0.73 mg/dL). Liver & Pancreas: Recent Labs  Lab 05/12/20 2336 05/13/20 1630 05/14/20 0242 05/14/20 2231 05/15/20 0249  AST 62* 55* 48* 58* 53*  ALT 44 36 32 31 29  ALKPHOS 123 103 88 81 81  BILITOT 4.8* 4.9* 5.0* 5.1* 5.2*  PROT 6.9 5.9* 5.3* 4.9* 5.0*  ALBUMIN 2.3* 2.0* 1.8* 1.7* 1.7*   Recent Labs  Lab 05/15/20 0249  LIPASE 30   Recent Labs  Lab 05/12/20 2336 05/15/20 0249  AMMONIA 20 16   Diabetic: No results for input(s): HGBA1C in the last 72 hours. Recent Labs  Lab 05/15/20 0038  GLUCAP 77   Cardiac Enzymes: Recent Labs  Lab 05/12/20 2336  CKTOTAL 54   No results for input(s): PROBNP in the last 8760 hours. Coagulation Profile: Recent Labs  Lab 05/12/20 2336  INR 1.5*   Thyroid Function Tests: Recent Labs    05/14/20 1247  TSH 4.598*   Lipid Profile: No results for input(s): CHOL, HDL, LDLCALC, TRIG, CHOLHDL, LDLDIRECT in the last 72 hours. Anemia Panel: Recent Labs    05/13/20 1630  VITAMINB12 4,314*  FOLATE 3.7*   Urine analysis:    Component Value Date/Time  COLORURINE AMBER (A) 05/13/2020 0200   APPEARANCEUR CLEAR 05/13/2020 0200   LABSPEC 1.020 05/13/2020 0200   PHURINE 5.0 05/13/2020 0200   GLUCOSEU NEGATIVE 05/13/2020 0200   HGBUR MODERATE (A) 05/13/2020 0200   BILIRUBINUR NEGATIVE 05/13/2020 0200   KETONESUR NEGATIVE 05/13/2020 0200   PROTEINUR 30 (A) 05/13/2020 0200   UROBILINOGEN >8.0 (H) 05/06/2013 0954   NITRITE NEGATIVE 05/13/2020 0200   LEUKOCYTESUR NEGATIVE 05/13/2020 0200   Sepsis Labs: Invalid input(s): PROCALCITONIN, [place]  Microbiology: Recent Results (from the past 240 hour(s))  Resp Panel by RT-PCR (Flu A&B, Covid) Nasopharyngeal Swab     Status: None   Collection Time: 05/12/20 11:36 PM   Specimen: Nasopharyngeal Swab; Nasopharyngeal(NP) swabs in vial transport medium  Result Value Ref Range Status   SARS Coronavirus 2 by RT PCR NEGATIVE NEGATIVE Final    Comment: (NOTE) SARS-CoV-2 target nucleic acids are NOT DETECTED.  The SARS-CoV-2 RNA is generally detectable in upper respiratory specimens during the acute phase of infection. The lowest concentration of SARS-CoV-2 viral copies this assay can detect is 138 copies/mL. A negative result does not preclude SARS-Cov-2 infection and should not be used as the sole basis for treatment or other patient management decisions. A negative result may occur with  improper specimen collection/handling, submission of specimen other than nasopharyngeal swab, presence of viral mutation(s) within the areas targeted by this assay, and inadequate number of viral copies(<138 copies/mL). A negative result must be combined with clinical observations, patient history, and epidemiological information. The expected result is Negative.  Fact Sheet for Patients:  EntrepreneurPulse.com.au  Fact Sheet for Healthcare Providers:  IncredibleEmployment.be  This test is no t yet approved or cleared by the [place] FDA and  has been authorized  for detection and/or diagnosis of SARS-CoV-2 by FDA under an Emergency Use Authorization (EUA). This EUA will remain  in effect (meaning this test can be used) for the duration of the COVID-19 declaration under Section 564(b)(1) of the Act, 21 U.S.C.section 360bbb-3(b)(1), unless the authorization is terminated  or revoked sooner.       Influenza A by PCR NEGATIVE NEGATIVE Final   Influenza B by PCR NEGATIVE NEGATIVE Final    Comment: (NOTE) The Xpert Xpress SARS-CoV-2/FLU/RSV plus assay is intended as an aid in the diagnosis of influenza from Nasopharyngeal swab specimens and should not be used as a sole basis for treatment. Nasal washings and aspirates are unacceptable for Xpert Xpress SARS-CoV-2/FLU/RSV testing.  Fact Sheet for Patients: EntrepreneurPulse.com.au  Fact Sheet for Healthcare Providers: IncredibleEmployment.be  This test is not yet approved or cleared by the [place] FDA and has been authorized for detection and/or diagnosis of SARS-CoV-2 by FDA under an Emergency Use Authorization (EUA). This EUA will remain in effect (meaning this test can be used) for the duration of the COVID-19 declaration under Section 564(b)(1) of the Act, 21 U.S.C. section 360bbb-3(b)(1), unless the authorization is terminated or revoked.  Performed at [facility], [place] [address] [place], [place] [address]   Urine Culture     Status: None   Collection Time: 05/13/20  2:00 AM   Specimen: Urine, Clean Catch  Result Value Ref Range Status   Specimen Description   Final    URINE, CLEAN CATCH Performed at [facility], [place] [address] [place], [place] [address]    Special Requests   Final    NONE Performed at [facility], [place] [address], [place], [place] [address]    Culture  Final    NO GROWTH Performed at [facility], [place] [address] [place], [place] [address]    Report  Status 05/14/2020 FINAL  Final  Culture, blood (Routine X 2) w Reflex to ID Panel     Status: None (Preliminary result)   Collection Time: 05/13/20  5:53 AM   Specimen: BLOOD  Result Value Ref Range Status   Specimen Description   Final    BLOOD RIGHT ANTECUBITAL Performed at [place] [address], [place], [place] [address]    Special Requests   Final    BOTTLES DRAWN AEROBIC AND ANAEROBIC Blood Culture results may not be optimal due to an inadequate volume of blood received in culture bottles Performed at [place] [address], [place], [place] [address]    Culture   Final    NO GROWTH 2 DAYS Performed at [place] [address] [place], [place] [address]    Report Status PENDING  Incomplete  Culture, blood (Routine X 2) w Reflex to ID Panel     Status: None (Preliminary result)   Collection Time: 05/13/20  5:53 AM   Specimen: BLOOD  Result Value Ref Range Status   Specimen Description   Final    BLOOD BLOOD RIGHT FOREARM Performed at [place] [address], [place], [place] [address]    Special Requests   Final    BOTTLES DRAWN AEROBIC AND ANAEROBIC Blood Culture results may not be optimal due to an inadequate volume of blood received in culture bottles Performed at [place] [address] [place], [place] [address]    Culture   Final    NO GROWTH 2 DAYS Performed at [place] [address], [place], [place] [address]    Report Status PENDING  Incomplete  MRSA PCR Screening     Status: None   Collection Time: 05/13/20  9:34 AM   Specimen: Nasal Mucosa; Nasopharyngeal  Result Value Ref Range Status   MRSA by PCR NEGATIVE NEGATIVE Final    Comment:        The GeneXpert MRSA Assay (FDA approved for NASAL specimens only), is one component of a comprehensive MRSA colonization surveillance program. It is not intended to diagnose MRSA infection nor to  guide or monitor treatment for MRSA infections. Performed at [facility], [place] [address], [place],  [address]     Radiology Studies: DG Chest Port 1 View  Result Date: 05/15/2020 CLINICAL DATA:  Pneumonia EXAM: PORTABLE CHEST 1 VIEW COMPARISON:  05/14/2020 FINDINGS: Enteric tube passes into the stomach with tip out of field of view. Minimal patchy density at the lung bases. No pleural effusion. No pneumothorax. Stable cardiomediastinal contours with normal heart size. IMPRESSION: Minimal bibasilar atelectasis/consolidation. Electronically Signed   By: Macy Mis M.D.   On: 05/15/2020 08:07   DG Abd Portable 1V  Result Date: 05/14/2020 CLINICAL DATA:  NG tube placement. EXAM: PORTABLE ABDOMEN - 1 VIEW COMPARISON:  CT yesterday FINDINGS: Tip of the weighted enteric tube is in the left upper quadrant in the region of the mid stomach. No bowel dilatation in the upper abdomen. IMPRESSION: Tip of the weighted enteric tube in the region of the mid stomach. Electronically Signed   By: Keith Rake M.D.   On: 05/14/2020 16:07   EEG adult  Result Date: 05/14/2020 Lora Havens, MD     05/14/2020  7:19 PM Patient  Name: Abhay Godbolt MRN: [id number] Epilepsy Attending: Lora Havens Referring Physician/Provider: Dr Cherylann Ratel Date: 05/14/2020 Duration: 23.19 mins Patient history: 65yo M with ams. EEG to evaluate for seizure Level of alertness: Awake AEDs during EEG study: None Technical aspects: This EEG study was done with scalp electrodes positioned according to the 10-20 International system of electrode placement. Electrical activity was acquired at a sampling rate of 500Hz  and reviewed with a high frequency filter of 70Hz  and a low frequency filter of 1Hz . EEG data were recorded continuously and digitally stored. Description: No posterior dominant rhythm was seen. EEG showed continuous generalized 5 to 7 Hz theta slowing. Hyperventilation and photic stimulation  were not performed.   ABNORMALITY - Continuous slow, generalized IMPRESSION: This study is suggestive ofmoderate diffuse encephalopathy, nonspecific etiology. No seizures or epileptiform discharges were seen throughout the recording. Priyanka Barbra Sarks    Taye T. [place]  If 7PM-7AM, please contact night-coverage [email or web address] 05/15/2020, 12:26 PM

## 2020-05-15 NOTE — Progress Notes (Signed)
NAME:  Shawn Nguyen, MRN:  [id number], DOB:  [birth year], LOS: 2 ADMISSION DATE:  05/12/2020, CONSULTATION DATE:  3/17 REFERRING MD:  Cyndia Skeeters, CHIEF COMPLAINT:  Acute metabolic encephalopathy and concern for airway protection    History of Present Illness:  65 year old male with history as mentioned below presented to the emergency room on 3/15 after being found by his neighbors hanging on from his couch confused and disheveled.  UDS was positive for THC.  Initial lactate 4.8.  Started on CIWA protocol.  Given IV fluid challenge for elevated lactate.  He had a CT abdomen pelvis obtained to rule out any sort of abdominal necrosis.  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  On 3/17 pulmonary asked to evaluate as patient remained lethargic/somnolent, intermittently hypotensive with systolic blood pressure in the 80s, and internal medicine service concern about airway protection Pertinent  Medical History  ETOH abuse, hep C, Cirrhosis, esophageal varices, HTN, ascites   Significant Hospital Events: Including procedures, antibiotic start and stop dates in addition to other pertinent events   . 3/15 admitted. Initial Lactate 4.8, scr 1.26, IV bolus administered. started on cefepime and flagyl. Cultures sent  . 3/16 lactate still elevated. CT abd pelvis obtained. Showed large infiltrated mass in R hepatic lobe concerning for [facility] w/ tumor thrombus in the hepatic Vein. There was also chololithiasis w/ non-specific GB wall thickening. Tace ascites. Aortic stenosis. LLL airspace disease. PCT 4.88/ lactic acid as high as 7.2 got more fluid . Intermittent hypotension. PCT trending down. Lactate 1.1 renal now nml. PCCM asked to see for borderline BP and encephalopathy w/ concern for airway protection   Interim History / Subjective:    Objective   Blood pressure 119/74, pulse 90, temperature (Abnormal) 97.4 F (36.3 C), temperature source Oral, resp. rate 16,  height 6' (1.829 m), weight 54.4 kg, SpO2 94 %.        Intake/Output Summary (Last 24 hours) at 05/15/2020 0846 Last data filed at 05/15/2020 0700 Gross per 24 hour  Intake 1293.67 ml  Output 1450 ml  Net -156.33 ml   Filed Weights   05/12/20 2322  Weight: 54.4 kg    Examination: General this is a frail 65 year old male. He is resting in bed. He is fairly sedated currently does wake up w/ general stim HENT temporal wasting. Poor dentition. Sclera a little icteric. Lips dry w/ dried blood. Feeding tube in place Pulm scattered rhonchi. No accessory use 2 lpm via [place] Card rrr abd soft  Ext warm  Neuro wakes up w/ gentle stimulus. Not following commands currently. Moves all ext. No focal def gu cl yellow   Labs/imaging personally reviewed   See below   Resolved Hospital Problem list   AKI (resolved as of 3/17) Lactic acidosis (resolved as of 3/17)  Assessment & Plan:   Acute metabolic/hepatic encephalopathy. Multifactorial. Acute hepatic encephalopathy, AKI, possible infection and renal dsyfxn. Was placed on CIWA protocol AGAIN last night w/ Cirrhosis not a good candidate for benzodiazepines Plan dc'd CIWA protocol and placed Lorazepam 0.5mg  IV dose for CIWA score > 10-->it is very important that we care careful w/ benzos here as a little seems to go a long way Cont thiamine and folate Medical restraints still needed Focus on freq re-orientation  Cont lactulose (I changed to TID as having liq stools and req flexi-seal)   Possible sepsis. Source not clear. Does have LLL infiltrate of CT abd and  given his encephalopathy this could reflect an aspiration event Plan Day 4 Cefepime and flagyl    Hypotension. Intermittent. Now 6 liters positive -prob still mix of relative hypotension and third spacing and possibly exacerbated by benzos. Cleared Lactate and normalized cr reassuring Plan Cont MIVF @ 63ml/hr Cont Midodrine  Cont tele   Mild Hypoxia 2/2 aspiration +/-  atx Plan Cont O2 Pulse ox  pulm hygiene   ETOH related Cirrhosis, Hep C and now what looks like [place] w/ tumor thrombus in the hepatic vein. Has associated abnormal LFTs and increased bili  This may be a large contributing factor to his encephalopathy Plan Supportive care Lactulose Scheduled for IR  Needs palliative consult    Fluid and electrolyte imbalance: Hyperchloremia (2/2 NaCL replacement) Plan Cont LR  Am chem   Severe thrombocytopenia. Prob mix of cirrhosis and possibly infection. Plan Tend cbc  Transfuse for plt < 10 Hold heparin   Mild coagulopathy Plan Vit K for INR > 1.5  Protein calorie malnutrition Plan Once he is done w/ procedures start tubefeeds  Aortic stenosis Plan NTD. Could refer to out-pt for monitoring but really don't think he is a surgical candidate   Poor dentition.  Plan Needs multiple extractions     Best practice (evaluated daily)  Diet:  NPO Pain/Anxiety/Delirium protocol (if indicated): No VAP protocol (if indicated): Not indicated DVT prophylaxis: SCD GI prophylaxis: N/A Glucose control:  SSI No Central venous access:  N/A Arterial line:  N/A Foley:  N/A Mobility:  bed rest  PT consulted: N/A Last date of multidisciplinary goals of care discussion [pending] Code Status:  full code   Disposition: ICU/stepdown critical care will s/o. I think most importantly needs palliative consult. Will place order.    Critical care time:  NA     Erick Colace ACNP-BC [place] Pager # [phone] OR # [phone] if no answer

## 2020-05-15 NOTE — TOC Progression Note (Signed)
Transition of Care [facility]) - Progression Note    Patient Details  Name: Shawn Nguyen MRN: [id number] Date of Birth: 05-18-1955  [facility] of [facility]) CM/SW Contact  Leeroy Cha, RN Phone Number: 05/15/2020, 2:43 PM  Clinical Narrative:    tct-[facility], will get [facility].  There are no [facility] bed today at [facility].        Expected Discharge Plan and Services                                                 Social Determinants of Health (SDOH) Interventions    Readmission Risk Interventions No flowsheet data found.

## 2020-05-15 NOTE — Progress Notes (Signed)
SLP Cancellation Note  Patient Details Name: Argil Mahl MRN: [id number] DOB: 1955/06/16   Cancelled treatment:       Reason Eval/Treat Not Completed: Fatigue/lethargy limiting ability to participate. Per NP, pt is currently somnolent due to sedating meds. Will check appropriateness over the weekend.  Wilma Michaelson B. Quentin Ore, [facility], [place] Speech Language Pathologist Office: [phone] Pager: [phone]  Shonna Chock 05/15/2020, 9:15 AM

## 2020-05-16 MED ORDER — GLYCOPYRROLATE 1 MG/5ML PO SOLN: 1.0000 mg | Freq: Four times a day (QID) | ORAL | 0 refills | Status: AC | PRN

## 2020-05-16 MED ORDER — LORAZEPAM 2 MG/ML IJ SOLN: 1.0000 mg | INTRAMUSCULAR | 0 refills | Status: AC | PRN

## 2020-05-16 MED ORDER — MORPHINE SULFATE (CONCENTRATE) 10 MG /0.5 ML PO SOLN: 10.0000 mg | ORAL | 0 refills | Status: AC | PRN

## 2020-05-16 MED ORDER — ONDANSETRON 4 MG PO TBDP: 4.0000 mg | ORAL_TABLET | Freq: Three times a day (TID) | ORAL | 0 refills | Status: AC | PRN

## 2020-05-16 NOTE — Discharge Summary (Signed)
Physician Discharge Summary  Shawn Nguyen BMW:[id number] DOB: Mar 12, 1955 DOA: 05/12/2020  PCP: Shawn Garter, MD  Admit date: 05/12/2020 Discharge date: 05/16/2020  Admitted From: Home Disposition: Residential hospice at [facility]   Discharge Condition: Stable for transfer CODE STATUS: DNR/DNI   Hospital Course: 65 year old male with history of EtOH abuse, hepatitis C, cirrhosis, esophageal varices and HTN brought to ED by EMS after found hanging of his Coreg by his neighbor who called EMS.   In ED, afebrile.  Hemodynamically stable.  Saturating in upper 90s on RA.  WBC 6.7.  Platelets 25.  K3.1.  Bicarb 19. Cr 1.26.  BUN 61.  AST 62.  Total bili 4.8.  Direct 2.5.  CK 54.  Troponin 39> 47.  Lactic acid 4.8>>5.1.  CXR without acute finding.  CT H/C-spine without acute finding.  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  Cultures obtained.  Patient was started on IV fluid and broad-spectrum antibiotics, and admitted for acute encephalopathy and AKI.    The next day, remained encephalopathic and hypotensive despite multiple IV boluses..  Basic encephalopathy work-up unrevealing.  EEG with moderate diffuse encephalopathy but no seizure or epileptiform discharge.  PCCM consulted and started on lactulose and midodrine via NG tube and recommended palliative medicine consult.  IR consulted for liver biopsy.   On 05/15/2020, extensive discussion about the patient's condition and poor prognosis with patient's sister, Shawn Nguyen who the only close family member alive. She says, she hasn't seen her brother for over a year but has been talking to him over the phone. She says she couldn't visit him because, he never told her his exact address.  We discussed CODE STATUS and the pros and cons of resuscitation.  I recommended DNR/DNI which she  agreed to.  We also discussed other scopes of care including comfort measures.  I do not believe current treatment is going to reverse the situation.  I recommended keeping him as comfortable and die peacefully when it is time.  Shawn Nguyen appreciated the information and agreed to full comfort measures.  She also appreciated the effort by the medical team to treat her brother but understands that he is very ill.  Patient transfer to [facility] for end-of-life care.  Left voicemail for patient sister.   Discharge Diagnoses:  End-of-life care/DNR/DNI -Transferred to residential hospice-[facility] -End-of-life care with as needed meds-Roxanol, Ativan, glycopyrrolate and Zofran  Acute metabolic encephalopathy-unclear etiology but concern for aspiration pneumonia and EtOH abuse.  He received 2 doses of Ativan.  Last dose was about midnight.  Work-up including ammonia, B12, CTH/C-spine without significant finding.  UDS positive for marijuana.  EEG with encephalopathy.  Remains encephalopathic  Aspiration pneumonia: CT abdomen and pelvis revealed LLL consolidation concerning for aspiration pneumonia given patient's mental status and respiratory secretions.  Procalcitonin elevated.  Cultures negative.  CCM signed off.  Liver cirrhosis/liver mass-CT abdomen and pelvis revealed 7.7 x 5.7 by 10.0 cm mass concerning for malignancy given patient's history of liver cirrhosis and hep C  AKI/azotemia: Likely due to dehydration.  Appears dry on exam.  AKI resolved.  Elevated liver enzymes/hyperbilirubinemia: Liver enzymes improved.  Total bili remains elevated.  Elevated troponin: Likely demand ischemia and delayed clearance.  Lactic acidosis: could be due to infection (pneumonia) and dehydration.  Resolved.  Hypokalemia: Magnesium was normal.  Tumor thrombus of hepatic vein  History  of hepatitis C-seems to be treated for this in 2017  History of alcohol abuse: No significant withdrawal  symptoms.  Thrombocytopenia: Likely due to liver cirrhosis from alcohol.   Coagulopathy: Likely due to liver failure.  Poor dentition  Aortic atherosclerosis  Severe malnutrition: Body mass index is 16.27 kg/m. Nutrition Problem: Inadequate oral intake Etiology: inability to eat Signs/Symptoms: NPO status Interventions: Refer to RD note for recommendations      Discharge Exam: Vitals:   05/15/20 2250 05/16/20 0550  BP: 94/62 90/61  Pulse: 73 70  Resp: 14 14  Temp: (!) 97.5 F (36.4 C) 97.7 F (36.5 C)  SpO2: 100% 99%    GENERAL: Frail and chronically ill-appearing. HEENT: Poor dentition RESP: On room air.  No IWOB.  CVS:  RRR. Heart sounds normal.  ABD/GI/GU: Diffuse tenderness. MSK/EXT:  Moves extremities.  Significant muscle mass and subcu fat loss. SKIN: no apparent skin lesion or wound NEURO: Somnolent but awakes.  Disoriented.  Does not follows command. PSYCH: No distress or agitation.  Discharge Instructions   Allergies as of 05/16/2020   No Known Allergies     Medication List    STOP taking these medications   furosemide 40 MG tablet Commonly known as: LASIX   omeprazole 20 MG capsule Commonly known as: PRILOSEC   ribavirin 200 MG capsule Commonly known as: REBETOL   Sofosbuvir-Velpatasvir 400-100 MG Tabs Commonly known as: Epclusa   spironolactone 25 MG tablet Commonly known as: Aldactone     TAKE these medications   Glycopyrrolate 1 MG/5ML Soln Take 5 mLs (1 mg total) by mouth 4 (four) times daily as needed for up to 3 days.   LORazepam 2 MG/ML injection Commonly known as: ATIVAN Inject 0.5 mLs (1 mg total) into the vein every 4 (four) hours as needed.   morphine CONCENTRATE 10 mg / 0.5 ml concentrated solution Take 0.5 mLs (10 mg total) by mouth every 3 (three) hours as needed for moderate pain, severe pain or shortness of breath.   ondansetron 4 MG disintegrating tablet Commonly known as: Zofran ODT Take 1 tablet (4 mg  total) by mouth every 8 (eight) hours as needed for nausea or vomiting.       Consultations:  Pulmonology/intensivist  Palliative medicine  Procedures/Studies:  2D Echo on 05/15/2020 1. Possible tricuspid valve vegetation. Independently mobile echodensity,  0.5 x 0.5 cm lesion on the ventricular aspect of the tricuspid valve.  Consider obtaining blood cultures.. The tricuspid valve is abnormal.  2. Left ventricular ejection fraction, by estimation, is 55 to 60%. The  left ventricle has normal function. The left ventricle has no regional  wall motion abnormalities. Left ventricular diastolic parameters are  indeterminate.  3. Right ventricular systolic function is mildly reduced. The right  ventricular size is normal. There is normal pulmonary artery systolic  pressure. The estimated right ventricular systolic pressure is 73.4 mmHg.  4. The mitral valve is normal in structure. Mild mitral valve  regurgitation. No evidence of mitral stenosis.  5. The aortic valve is abnormal. There is moderate calcification of the  aortic valve. Aortic valve regurgitation is mild. No aortic stenosis is  present.  6. The inferior vena cava is normal in size with greater than 50%  respiratory variability, suggesting right atrial pressure of 3 mmHg.  7. Left atrial size was mildly dilated.   DG Chest 1 View  Result Date: 05/12/2020 CLINICAL DATA:  Fall.  Lethargy. EXAM: CHEST  1 VIEW COMPARISON:  None. FINDINGS: Lungs are  hyperinflated. Normal heart size and mediastinal contours. No pneumothorax or pleural effusion. A skin fold projects over the right hemithorax on image 2 of 2. No focal airspace disease. No pulmonary edema. No acute osseous abnormalities are seen IMPRESSION: Hyperinflation without acute abnormality. Electronically Signed   By: Keith Rake M.D.   On: 05/12/2020 23:39   CT HEAD WO CONTRAST  Result Date: 05/13/2020 CLINICAL DATA:  Intoxicated, altered mental status. EXAM:  CT HEAD WITHOUT CONTRAST CT CERVICAL SPINE WITHOUT CONTRAST TECHNIQUE: Multidetector CT imaging of the head and cervical spine was performed following the standard protocol without intravenous contrast. Multiplanar CT image reconstructions of the cervical spine were also generated. COMPARISON:  None. FINDINGS: CT HEAD FINDINGS Brain: No evidence of large-territorial acute infarction. No parenchymal hemorrhage. No mass lesion. No extra-axial collection. No mass effect or midline shift. No hydrocephalus. Basilar cisterns are patent. Vascular: No hyperdense vessel. Skull: No acute fracture or focal lesion. Sinuses/Orbits: Mucosal thickening of the left maxillary sinus. Under pneumatization of the frontal sinuses. Otherwise the paranasal sinuses and mastoid air cells are clear. The orbits are unremarkable. Other: None. CT CERVICAL SPINE FINDINGS Alignment: For reversal of the normal cervical lordosis centered at the C3 level likely due to degenerative changes and positioning. Skull base and vertebrae: C3-C4 intervertebral disc space narrowing, osteophyte formation. C1-C2 degenerative changes. No acute fracture. No aggressive appearing focal osseous lesion or focal pathologic process. Soft tissues and spinal canal: No prevertebral fluid or swelling. No visible canal hematoma. Upper chest: Trace biapical pleural/pulmonary scarring. Other: None. IMPRESSION: 1. No acute intracranial abnormality. 2. No acute displaced fracture or traumatic listhesis of the cervical spine. Electronically Signed   By: Iven Finn M.D.   On: 05/13/2020 00:58   CT CERVICAL SPINE WO CONTRAST  Result Date: 05/13/2020 CLINICAL DATA:  Intoxicated, altered mental status. EXAM: CT HEAD WITHOUT CONTRAST CT CERVICAL SPINE WITHOUT CONTRAST TECHNIQUE: Multidetector CT imaging of the head and cervical spine was performed following the standard protocol without intravenous contrast. Multiplanar CT image reconstructions of the cervical spine were  also generated. COMPARISON:  None. FINDINGS: CT HEAD FINDINGS Brain: No evidence of large-territorial acute infarction. No parenchymal hemorrhage. No mass lesion. No extra-axial collection. No mass effect or midline shift. No hydrocephalus. Basilar cisterns are patent. Vascular: No hyperdense vessel. Skull: No acute fracture or focal lesion. Sinuses/Orbits: Mucosal thickening of the left maxillary sinus. Under pneumatization of the frontal sinuses. Otherwise the paranasal sinuses and mastoid air cells are clear. The orbits are unremarkable. Other: None. CT CERVICAL SPINE FINDINGS Alignment: For reversal of the normal cervical lordosis centered at the C3 level likely due to degenerative changes and positioning. Skull base and vertebrae: C3-C4 intervertebral disc space narrowing, osteophyte formation. C1-C2 degenerative changes. No acute fracture. No aggressive appearing focal osseous lesion or focal pathologic process. Soft tissues and spinal canal: No prevertebral fluid or swelling. No visible canal hematoma. Upper chest: Trace biapical pleural/pulmonary scarring. Other: None. IMPRESSION: 1. No acute intracranial abnormality. 2. No acute displaced fracture or traumatic listhesis of the cervical spine. Electronically Signed   By: Iven Finn M.D.   On: 05/13/2020 00:58   CT ABDOMEN PELVIS W CONTRAST  Result Date: 05/13/2020 CLINICAL DATA:  Abdominal pain, hepatitis C, cirrhosis EXAM: CT ABDOMEN AND PELVIS WITH CONTRAST TECHNIQUE: Multidetector CT imaging of the abdomen and pelvis was performed using the standard protocol following bolus administration of intravenous contrast. CONTRAST:  118mL OMNIPAQUE IOHEXOL 300 MG/ML  SOLN COMPARISON:  11/10/2015 FINDINGS: Lower chest: Patchy consolidation  within the left lower lobe may reflect hypoventilatory change, airspace disease, or even aspiration. No effusion or pneumothorax. Hepatobiliary: The liver is cirrhotic. A large infiltrating mass is seen throughout the  right lobe of the liver, measuring up to 7.7 x 5.7 by 10.0 cm. Multiple central areas of necrosis are identified. This mass demonstrates heterogeneous enhancement, with areas of delayed washout and delayed enhancement identified especially along the inferior margin of this mass. There are areas of intrahepatic biliary dilation, as well as tumor thrombus extending into the middle hepatic vein. Overall, imaging characteristics are consistent with cholangiocarcinoma or biphenotypic hepatocellular carcinoma. There are calcified gallstones dependently within the gallbladder. Mild diffuse nonspecific gallbladder wall thickening is noted. No evidence of common bile duct dilation. Pancreas: Unremarkable. No pancreatic ductal dilatation or surrounding inflammatory changes. Spleen: Normal in size without focal abnormality. Adrenals/Urinary Tract: Adrenal glands are unremarkable. Kidneys are normal, without renal calculi, focal lesion, or hydronephrosis. Bladder is unremarkable. Stomach/Bowel: No bowel obstruction or ileus. No bowel wall thickening or inflammatory change. Vascular/Lymphatic: There is significant eccentric posterior atheromatous plaque within the abdominal aorta just inferior to the level of the renal arteries. This plaque extends approximately 3.5 cm in length, narrows the lumen of the aorta by approximately 50%. Similar atheromatous plaque is seen at the origin of the right common iliac artery with estimated 50% stenosis. Diffuse atherosclerosis throughout the remainder of the aorta and its branches. 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 Remaining venous structures are unremarkable. No pathologic adenopathy within the abdomen or pelvis. Reproductive: Prostate is unremarkable. Other: Trace ascites within the right upper quadrant. No free intraperitoneal gas. No abdominal wall hernia. Musculoskeletal: No acute or destructive bony lesions. There is grade 1 anterolisthesis of L3 on L4,  with bilateral L3 spondylolysis. Moderate spondylosis at the L3-4 level. Reconstructed images demonstrate no additional findings. IMPRESSION: 1. 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 This mass is likely amenable to ultrasound-guided biopsy. 2. Patchy consolidation left lower lobe favor airspace disease over atelectasis. Aspiration could be considered in the appropriate setting. 3. Aortic Atherosclerosis (ICD10-I70.0). Prominent eccentric atheromatous plaque within the mid abdominal aorta and at the origin of the right common iliac artery, with approximately 50% stenosis at each location. 4. Cholelithiasis, with mild nonspecific gallbladder wall thickening felt to be due to trace ascites and hypoproteinemic state given background hepatic cirrhosis. Electronically Signed   By: Randa Ngo M.D.   On: 05/13/2020 21:24   US RENAL  Result Date: 05/13/2020 CLINICAL DATA:  Acute kidney injury. EXAM: RENAL / URINARY TRACT ULTRASOUND COMPLETE COMPARISON:  None. FINDINGS: Right Kidney: Renal measurements: 12.4 x 5.2 x 9.2 cm = volume: 242 mL. Echogenicity within normal limits. No mass or hydronephrosis visualized. Left Kidney: Renal measurements: 11.2 x 6.2 x 5.9 cm = volume: 214 mL. Echogenicity within normal limits. No mass or hydronephrosis visualized. Bladder: Appears normal for degree of bladder distention. No ureteral jets are noted. Other: None. IMPRESSION: Normal renal ultrasound. Electronically Signed   By: Marijo Conception M.D.   On: 05/13/2020 16:38   DG Chest Port 1 View  Result Date: 05/15/2020 CLINICAL DATA:  Pneumonia EXAM: PORTABLE CHEST 1 VIEW COMPARISON:  05/14/2020 FINDINGS: Enteric tube passes into the stomach with tip out of field of view. Minimal patchy density at the  lung bases. No pleural  effusion. No pneumothorax. Stable cardiomediastinal contours with normal heart size. IMPRESSION: Minimal bibasilar atelectasis/consolidation. Electronically Signed   By: Macy Mis M.D.   On: 05/15/2020 08:07   DG Chest Port 1 View  Result Date: 05/14/2020 CLINICAL DATA:  Hypertension, cirrhosis, hepatitis-C. Altered mental status. EXAM: PORTABLE CHEST 1 VIEW COMPARISON:  05/12/2020 FINDINGS: Increasing airspace opacity in the left lower lung concerning for early infiltrate. Right lung clear. Heart is normal size. No effusions. Mild hyperinflation, stable. No acute bony abnormality. IMPRESSION: Stable hyperinflation. Increasing vague opacity in the left lower lung concerning for early infiltrate/pneumonia. Electronically Signed   By: Rolm Baptise M.D.   On: 05/14/2020 09:14   DG Abd Portable 1V  Result Date: 05/14/2020 CLINICAL DATA:  NG tube placement. EXAM: PORTABLE ABDOMEN - 1 VIEW COMPARISON:  CT yesterday FINDINGS: Tip of the weighted enteric tube is in the left upper quadrant in the region of the mid stomach. No bowel dilatation in the upper abdomen. IMPRESSION: Tip of the weighted enteric tube in the region of the mid stomach. Electronically Signed   By: Keith Rake M.D.   On: 05/14/2020 16:07   EEG adult  Result Date: 05/14/2020 Lora Havens, MD     05/14/2020  7:19 PM Patient Name: Barnett Elzey MRN: [id number] Epilepsy Attending: Lora Havens Referring Physician/Provider: Dr Cherylann Ratel Date: 05/14/2020 Duration: 23.19 mins Patient history: 65yo M with ams. EEG to evaluate for seizure Level of alertness: Awake AEDs during EEG study: None Technical aspects: This EEG study was done with scalp electrodes positioned according to the 10-20 International system of electrode placement. Electrical activity was acquired at a sampling rate of 500Hz  and reviewed with a high frequency filter of 70Hz  and a low frequency filter of 1Hz . EEG data were recorded  continuously and digitally stored. Description: No posterior dominant rhythm was seen. EEG showed continuous generalized 5 to 7 Hz theta slowing. Hyperventilation and photic stimulation were not performed.   ABNORMALITY - Continuous slow, generalized IMPRESSION: This study is suggestive ofmoderate diffuse encephalopathy, nonspecific etiology. No seizures or epileptiform discharges were seen throughout the recording. Lora Havens   ECHOCARDIOGRAM COMPLETE  Result Date: 05/15/2020    ECHOCARDIOGRAM REPORT   Patient Name:   BRANKO STEEVES Date of Exam: 05/15/2020 Medical Rec #:  [id number]      Height:       72.0 in Accession #:    [id number]     Weight:       120.0 lb Date of Birth:  1955-04-15      BSA:          1.715 m Patient Age:    52 years       BP:           112/75 mmHg Patient Gender: M              HR:           88 bpm. Exam Location:  Inpatient Procedure: 2D Echo, Cardiac Doppler and Color Doppler Indications:    Hypotension [id number]  History:        Patient has prior history of Echocardiogram examinations, most                 recent 08/23/2013. Risk Factors:Hypertension. Alcoholism.                 Ascites.  Sonographer:    Tiffany Dance Referring Phys: [id number] Mer[facility] Riding  Sonographer Comments: Suboptimal parasternal window. IMPRESSIONS  1.  Possible tricuspid valve vegetation. Independently mobile echodensity, 0.5 x 0.5 cm lesion on the ventricular aspect of the tricuspid valve. Consider obtaining blood cultures.. The tricuspid valve is abnormal.  2. Left ventricular ejection fraction, by estimation, is 55 to 60%. The left ventricle has normal function. The left ventricle has no regional wall motion abnormalities. Left ventricular diastolic parameters are indeterminate.  3. Right ventricular systolic function is mildly reduced. The right ventricular size is normal. There is normal pulmonary artery systolic pressure. The estimated right ventricular systolic pressure is 09.4 mmHg.  4. The mitral valve  is normal in structure. Mild mitral valve regurgitation. No evidence of mitral stenosis.  5. The aortic valve is abnormal. There is moderate calcification of the aortic valve. Aortic valve regurgitation is mild. No aortic stenosis is present.  6. The inferior vena cava is normal in size with greater than 50% respiratory variability, suggesting right atrial pressure of 3 mmHg.  7. Left atrial size was mildly dilated. FINDINGS  Left Ventricle: Left ventricular ejection fraction, by estimation, is 55 to 60%. The left ventricle has normal function. The left ventricle has no regional wall motion abnormalities. The left ventricular internal cavity size was normal in size. There is  no left ventricular hypertrophy. Left ventricular diastolic parameters are indeterminate. Right Ventricle: The right ventricular size is normal. No increase in right ventricular wall thickness. Right ventricular systolic function is mildly reduced. There is normal pulmonary artery systolic pressure. The tricuspid regurgitant velocity is 2.52 m/s, and with an assumed right atrial pressure of 3 mmHg, the estimated right ventricular systolic pressure is 70.9 mmHg. Left Atrium: Left atrial size was mildly dilated. Right Atrium: Right atrial size was normal in size. Pericardium: Trivial pericardial effusion is present. Mitral Valve: The mitral valve is normal in structure. Mild mitral valve regurgitation. No evidence of mitral valve stenosis. Tricuspid Valve: Possible tricuspid valve vegetation. Independently mobile echodensity, 0.5 x 0.5 cm lesion on the ventricular aspect of the tricuspid valve. Consider obtaining blood cultures. The tricuspid valve is abnormal. Tricuspid valve regurgitation is trivial. No evidence of tricuspid stenosis. Aortic Valve: The aortic valve is abnormal. There is moderate calcification of the aortic valve. Aortic valve regurgitation is mild. Aortic regurgitation PHT measures 362 msec. No aortic stenosis is present.  Aortic valve mean gradient measures 5.0 mmHg. Aortic valve peak gradient measures 9.0 mmHg. Aortic valve area, by VTI measures 1.97 cm. Pulmonic Valve: The pulmonic valve was not well visualized. Pulmonic valve regurgitation is not visualized. No evidence of pulmonic stenosis. Aorta: The aortic root is normal in size and structure. Venous: The inferior vena cava is normal in size with greater than 50% respiratory variability, suggesting right atrial pressure of 3 mmHg. IAS/Shunts: No atrial level shunt detected by color flow Doppler.  LEFT VENTRICLE PLAX 2D LVIDd:         3.60 cm  Diastology LVIDs:         2.90 cm  LV e' medial:    5.33 cm/s LV PW:         1.00 cm  LV E/e' medial:  11.6 LV IVS:        1.00 cm  LV e' lateral:   10.20 cm/s LVOT diam:     2.20 cm  LV E/e' lateral: 6.0 LV SV:         54 LV SV Index:   31 LVOT Area:     3.80 cm  RIGHT VENTRICLE  IVC RV Basal diam:  3.00 cm     IVC diam: 1.10 cm RV Mid diam:    1.80 cm RV S prime:     11.90 cm/s TAPSE (M-mode): 2.0 cm LEFT ATRIUM             Index       RIGHT ATRIUM           Index LA diam:        2.50 cm 1.46 cm/m  RA Area:     12.60 cm LA Vol (A2C):   34.2 ml 19.94 ml/m RA Volume:   25.30 ml  14.75 ml/m LA Vol (A4C):   50.9 ml 29.68 ml/m LA Biplane Vol: 41.4 ml 24.14 ml/m  AORTIC VALVE AV Area (Vmax):    1.91 cm AV Area (Vmean):   1.84 cm AV Area (VTI):     1.97 cm AV Vmax:           150.00 cm/s AV Vmean:          104.500 cm/s AV VTI:            0.274 m AV Peak Grad:      9.0 mmHg AV Mean Grad:      5.0 mmHg LVOT Vmax:         75.20 cm/s LVOT Vmean:        50.600 cm/s LVOT VTI:          0.142 m LVOT/AV VTI ratio: 0.52 AI PHT:            362 msec  AORTA Ao Root diam: 3.70 cm MITRAL VALVE               TRICUSPID VALVE MV Area (PHT): 2.87 cm    TR Peak grad:   25.4 mmHg MV Decel Time: 264 msec    TR Vmax:        252.00 cm/s MV E velocity: 61.60 cm/s MV A velocity: 63.60 cm/s  SHUNTS MV E/A ratio:  0.97        Systemic VTI:  0.14 m                             Systemic Diam: 2.20 cm Cherlynn Kaiser MD Electronically signed by Cherlynn Kaiser MD Signature Date/Time: 05/15/2020/4:39:19 PM    Final         The results of significant diagnostics from this hospitalization (including imaging, microbiology, ancillary and laboratory) are listed below for reference.     Microbiology: Recent Results (from the past 240 hour(s))  Resp Panel by RT-PCR (Flu A&B, Covid) Nasopharyngeal Swab     Status: None   Collection Time: 05/12/20 11:36 PM   Specimen: Nasopharyngeal Swab; Nasopharyngeal(NP) swabs in vial transport medium  Result Value Ref Range Status   SARS Coronavirus 2 by RT PCR NEGATIVE NEGATIVE Final    Comment: (NOTE) SARS-CoV-2 target nucleic acids are NOT DETECTED.  The SARS-CoV-2 RNA is generally detectable in upper respiratory specimens during the acute phase of infection. The lowest concentration of SARS-CoV-2 viral copies this assay can detect is 138 copies/mL. A negative result does not preclude SARS-Cov-2 infection and should not be used as the sole basis for treatment or other patient management decisions. A negative result may occur with  improper specimen collection/handling, submission of specimen other than nasopharyngeal swab, presence of viral mutation(s) within the areas targeted by this assay, and inadequate number of viral copies(<138 copies/mL). A  negative result must be combined with clinical observations, patient history, and epidemiological information. The expected result is Negative.  Fact Sheet for Patients:  EntrepreneurPulse.com.au  Fact Sheet for Healthcare Providers:  IncredibleEmployment.be  This test is no t yet approved or cleared by the [place] FDA and  has been authorized for detection and/or diagnosis of SARS-CoV-2 by FDA under an Emergency Use Authorization (EUA). This EUA will remain  in effect (meaning this test can be used) for the  duration of the COVID-19 declaration under Section 564(b)(1) of the Act, 21 U.S.C.section 360bbb-3(b)(1), unless the authorization is terminated  or revoked sooner.       Influenza A by PCR NEGATIVE NEGATIVE Final   Influenza B by PCR NEGATIVE NEGATIVE Final    Comment: (NOTE) The Xpert Xpress SARS-CoV-2/FLU/RSV plus assay is intended as an aid in the diagnosis of influenza from Nasopharyngeal swab specimens and should not be used as a sole basis for treatment. Nasal washings and aspirates are unacceptable for Xpert Xpress SARS-CoV-2/FLU/RSV testing.  Fact Sheet for Patients: EntrepreneurPulse.com.au  Fact Sheet for Healthcare Providers: IncredibleEmployment.be  This test is not yet approved or cleared by the [place] FDA and has been authorized for detection and/or diagnosis of SARS-CoV-2 by FDA under an Emergency Use Authorization (EUA). This EUA will remain in effect (meaning this test can be used) for the duration of the COVID-19 declaration under Section 564(b)(1) of the Act, 21 U.S.C. section 360bbb-3(b)(1), unless the authorization is terminated or revoked.  Performed at [facility], [place] [address], [place], [place] [address]   Urine Culture     Status: None   Collection Time: 05/13/20  2:00 AM   Specimen: Urine, Clean Catch  Result Value Ref Range Status   Specimen Description   Final    URINE, CLEAN CATCH Performed at [facility], [place] [address] [place], [place] [address]    Special Requests   Final    NONE Performed at [facility], [place] [address], [place], [place] [address]    Culture   Final    NO GROWTH Performed at [facility], [place] [address] [place], [place] [address]    Report Status 05/14/2020 FINAL  Final  Culture, blood (Routine X 2) w Reflex to ID Panel     Status: None (Preliminary result)   Collection Time: 05/13/20  5:53 AM   Specimen:  BLOOD  Result Value Ref Range Status   Specimen Description   Final    BLOOD RIGHT ANTECUBITAL Performed at [place] [address] [place], [place] [address]    Special Requests   Final    BOTTLES DRAWN AEROBIC AND ANAEROBIC Blood Culture results may not be optimal due to an inadequate volume of blood received in culture bottles Performed at [place] [address], [place], [place] [address]    Culture   Final    NO GROWTH 3 DAYS Performed at [facility], [place] [address] [place], [place] [address]    Report Status PENDING  Incomplete  Culture, blood (Routine X 2) w Reflex to ID Panel     Status: None (Preliminary result)   Collection Time: 05/13/20  5:53 AM   Specimen: BLOOD  Result Value Ref Range Status   Specimen Description   Final    BLOOD BLOOD RIGHT FOREARM Performed at [place] [address] [place], [place] [address]    Special Requests  Final    BOTTLES DRAWN AEROBIC AND ANAEROBIC Blood Culture results may not be optimal due to an inadequate volume of blood received in culture bottles Performed at [facility], [place] [address], [place], [place] [address]    Culture   Final    NO GROWTH 3 DAYS Performed at [facility], [place] [address], [place], [place] [address]    Report Status PENDING  Incomplete  MRSA PCR Screening     Status: None   Collection Time: 05/13/20  9:34 AM   Specimen: Nasal Mucosa; Nasopharyngeal  Result Value Ref Range Status   MRSA by PCR NEGATIVE NEGATIVE Final    Comment:        The GeneXpert MRSA Assay (FDA approved for NASAL specimens only), is one component of a comprehensive MRSA colonization surveillance program. It is not intended to diagnose MRSA infection nor to guide or monitor treatment for MRSA infections. Performed at [facility], [place] [address] [place], [place] [address]      Labs:  CBC: Recent  Labs  Lab 05/12/20 2336 05/14/20 0242 05/15/20 0249  WBC 6.7 7.3 8.0  NEUTROABS 4.8  --   --   HGB 13.8 12.9* 12.1*  HCT 39.9 36.7* 36.7*  MCV 83.5 83.4 87.4  PLT 25* 32* 28*   BMP &GFR Recent Labs  Lab 05/12/20 2336 05/13/20 1630 05/14/20 0242 05/14/20 0859 05/14/20 2231 05/15/20 0249  NA 139 141 143  --  144 144  K 3.1* 3.1* 3.4*  --  4.3 3.7  CL 105 109 113*  --  116* 116*  CO2 19* 19* 26  --  18* 20*  GLUCOSE 122* 92 83  --  68* 78  BUN 61* 39* 35*  --  25* 23  CREATININE 1.26* 0.85 0.88  --  0.75 0.73  CALCIUM 8.1* 7.8* 7.7*  --  7.4* 7.6*  MG  --  2.4  --  2.2 2.0 2.0  PHOS  --   --   --   --  2.3* 2.4*   Estimated Creatinine Clearance: 72.7 mL/min (by C-G formula based on SCr of 0.73 mg/dL). Liver & Pancreas: Recent Labs  Lab 05/12/20 2336 05/13/20 1630 05/14/20 0242 05/14/20 2231 05/15/20 0249  AST 62* 55* 48* 58* 53*  ALT 44 36 32 31 29  ALKPHOS 123 103 88 81 81  BILITOT 4.8* 4.9* 5.0* 5.1* 5.2*  PROT 6.9 5.9* 5.3* 4.9* 5.0*  ALBUMIN 2.3* 2.0* 1.8* 1.7* 1.7*   Recent Labs  Lab 05/15/20 0249  LIPASE 30   Recent Labs  Lab 05/12/20 2336 05/15/20 0249  AMMONIA 20 16   Diabetic: No results for input(s): HGBA1C in the last 72 hours. Recent Labs  Lab 05/15/20 0038  GLUCAP 77   Cardiac Enzymes: Recent Labs  Lab 05/12/20 2336  CKTOTAL 54   No results for input(s): PROBNP in the last 8760 hours. Coagulation Profile: Recent Labs  Lab 05/12/20 2336  INR 1.5*   Thyroid Function Tests: Recent Labs    05/14/20 1247  TSH 4.598*   Lipid Profile: No results for input(s): CHOL, HDL, LDLCALC, TRIG, CHOLHDL, LDLDIRECT in the last 72 hours. Anemia Panel: Recent Labs    05/13/20 1630  VITAMINB12 4,314*  FOLATE 3.7*   Urine analysis:    Component Value Date/Time   COLORURINE AMBER (A) 05/13/2020 0200   APPEARANCEUR CLEAR 05/13/2020 0200   LABSPEC 1.020 05/13/2020 0200   PHURINE 5.0 05/13/2020 0200   GLUCOSEU  NEGATIVE 05/13/2020  0200   HGBUR MODERATE (A) 05/13/2020 0200   BILIRUBINUR NEGATIVE 05/13/2020 0200   KETONESUR NEGATIVE 05/13/2020 0200   PROTEINUR 30 (A) 05/13/2020 0200   UROBILINOGEN >8.0 (H) 05/06/2013 0954   NITRITE NEGATIVE 05/13/2020 0200   LEUKOCYTESUR NEGATIVE 05/13/2020 0200   Sepsis Labs: Invalid input(s): PROCALCITONIN, LACTICIDVEN   Time coordinating discharge: 35 minutes  SIGNED:  Mercy Riding, MD  [facility] 05/16/2020, 12:31 PM  If 7PM-7AM, please contact night-coverage [email or web address]

## 2020-05-16 NOTE — TOC Transition Note (Signed)
Transition of Care [facility]) - CM/SW Discharge Note   Patient Details  Name: Taiga Lupinacci MRN: [id number] Date of Birth: 08-03-1955  Transition of Care [facility] CM/SW Contact:  Ross Ludwig, LCSW Phone Number: 05/16/2020, 12:50 PM   Clinical Narrative:     CSW was informed that patient has a bed available at [facility] for today.  CSW updated bedside nurse and attending physician.  Patient to be d/c'ed today to [facility].  Patient and family agreeable to plans will transport via ems RN to call [phone].  Final next level of care: [place] Barriers to Discharge: Barriers Resolved   Patient Goals and CMS Choice Patient states their goals for this hospitalization and ongoing recovery are:: To go to [facility] for end of life care. CMS Medicare.gov Compare Post Acute Care list provided to:: Patient Represenative (must comment) Choice offered to / list presented to : [facility] / Guardian  Discharge Placement  Patient discharging to [facility] for hospice facility placement and end of life care.            Patient chooses bed at: Other - please specify in the comment section below: ([place]) Patient to be transferred to facility by: [facility] Name of family member notified: Anuar, [place] [phone] Patient and family notified of of transfer: 05/16/20  Discharge Plan and Services In-house Referral: Hospice / Palliative Care Discharge Planning Services: CM Consult,Other - See comment ([facility]                                 Social Determinants of Health (SDOH) Interventions     Readmission Risk Interventions No flowsheet data found.

## 2020-05-16 NOTE — Progress Notes (Addendum)
Manufacturing engineer [facility])  Referral received for residential hospice at [facility].  Met with patient and his sister Shawn Nguyen at the bedside. Shawn Nguyen is interested in [facility].  Mr. Kuster is eligible for residential hospice and we have a bed today.   Once his sister completes necessary consents, he can be transported to [facility].  RN staff, please call report to [phone] at any time, bed assigned at this time.  Please remove his coretrak tube as we will not use that for medication administration at [facility]. You may leave IV in.  Once completed, fax dc summary to [phone].  Update TOC manager Eric with current plan.  Will update Randall Hiss once all consents are completed and then pt can be transported to [facility].  **Please call his sister Shawn Nguyen once transport arrives. She is planning on leaving to go back to [place] and would like to be notified if she is not there.  Venia Carbon RN, BSN, [facility] Liaison  **necessary consents are completed, transport can be arranged, updated [facility].

## 2020-05-17 NOTE — Progress Notes (Signed)
Patient discharged to [place], report called to [facility].

## 2020-05-18 LAB — CULTURE, BLOOD (ROUTINE X 2)
Culture: NO GROWTH
Culture: NO GROWTH

## 2020-05-28 LAB — VITAMIN B1: Vitamin B1 (Thiamine): 30.7 nmol/L — ABNORMAL LOW (ref 66.5–200.0)

## 2020-05-29 DEATH — deceased
